# Patient Record
Sex: Female | Born: 1975 | Race: White | Hispanic: No | Marital: Married | Smoking: Never smoker
Health system: Southern US, Community
[De-identification: ages and names within clinical notes are randomized; demographics above are authoritative.]

## PROBLEM LIST (undated history)

## (undated) DIAGNOSIS — N200 Calculus of kidney: Secondary | ICD-10-CM

## (undated) DIAGNOSIS — I10 Essential (primary) hypertension: Secondary | ICD-10-CM

## (undated) DIAGNOSIS — E119 Type 2 diabetes mellitus without complications: Secondary | ICD-10-CM

## (undated) HISTORY — PX: LITHOTRIPSY: SUR834

---

## 1999-10-25 ENCOUNTER — Other Ambulatory Visit: Admission: RE | Admit: 1999-10-25 | Discharge: 1999-10-25 | Payer: Self-pay | Admitting: Otolaryngology

## 2009-12-10 ENCOUNTER — Inpatient Hospital Stay: Payer: Self-pay | Admitting: Internal Medicine

## 2009-12-27 ENCOUNTER — Ambulatory Visit: Payer: Self-pay

## 2010-01-06 ENCOUNTER — Ambulatory Visit: Payer: Self-pay

## 2010-02-06 ENCOUNTER — Ambulatory Visit: Payer: Self-pay

## 2010-03-15 ENCOUNTER — Ambulatory Visit: Payer: Self-pay

## 2010-04-07 ENCOUNTER — Ambulatory Visit: Payer: Self-pay

## 2011-11-20 ENCOUNTER — Ambulatory Visit: Payer: Self-pay | Admitting: Obstetrics and Gynecology

## 2011-11-23 ENCOUNTER — Ambulatory Visit: Payer: Self-pay | Admitting: Obstetrics and Gynecology

## 2012-07-31 ENCOUNTER — Ambulatory Visit: Payer: Self-pay | Admitting: Urology

## 2012-07-31 LAB — BASIC METABOLIC PANEL
Anion Gap: 5 — ABNORMAL LOW (ref 7–16)
BUN: 10 mg/dL (ref 7–18)
Co2: 29 mmol/L (ref 21–32)
Creatinine: 0.89 mg/dL (ref 0.60–1.30)
EGFR (Non-African Amer.): >60
Sodium: 140 mmol/L (ref 136–145)

## 2012-08-01 ENCOUNTER — Ambulatory Visit: Payer: Self-pay | Admitting: Urology

## 2012-08-14 ENCOUNTER — Ambulatory Visit: Payer: Self-pay | Admitting: Urology

## 2012-08-15 ENCOUNTER — Ambulatory Visit: Payer: Self-pay | Admitting: Urology

## 2012-10-10 ENCOUNTER — Ambulatory Visit: Payer: Self-pay | Admitting: Unknown Physician Specialty

## 2012-10-10 LAB — BASIC METABOLIC PANEL
Anion Gap: 4 — ABNORMAL LOW (ref 7–16)
BUN: 11 mg/dL (ref 7–18)
Calcium, Total: 8.8 mg/dL (ref 8.5–10.1)
Chloride: 102 mmol/L (ref 98–107)
Co2: 29 mmol/L (ref 21–32)
Creatinine: 0.79 mg/dL (ref 0.60–1.30)
EGFR (African American): >60
EGFR (Non-African Amer.): >60
Osmolality: 278 (ref 275–301)
Potassium: 3.8 mmol/L (ref 3.5–5.1)
Sodium: 135 mmol/L — ABNORMAL LOW (ref 136–145)

## 2012-10-14 ENCOUNTER — Ambulatory Visit: Payer: Self-pay | Admitting: Urology

## 2014-05-29 NOTE — H&P (Signed)
PATIENT NAME:  Caitlin Blackburn, Caitlin Blackburn MR#:  [id number] DATE OF BIRTH:  March 15, 1975  DATE OF ADMISSION:  10/14/2012  CHIEF COMPLAINT: Kidney stone.   HISTORY OF PRESENT ILLNESS: Caitlin Blackburn is a 39 year old white female with a large left renal pelvic stone who underwent lithotripsy June 26 and July 10. She has a residual distal left ureteral fragment present and comes in now for ureteroscopic ureterolithotomy with holmium laser lithotripsy.   ALLERGIES: No drug allergies.   CURRENT MEDICATIONS: Humalog insulin, Levemir insulin, quinapril, HCTZ, phentermine, and allergy shots.   PAST SURGICAL HISTORY:  Sinus surgery in 2002.   SOCIAL HISTORY: The patient denied tobacco or alcohol use.   FAMILY HISTORY: Remarkable for grandparents with diabetes and hypertension.   PAST AND CURRENT MEDICAL CONDITIONS:  1.  Diabetes.  2.  Hypertension.  3.  Obesity.  4.  Allergic rhinitis.   REVIEW OF SYSTEMS:  The patient denies chest pain, shortness of breath, stroke, or heart disease.   PHYSICAL EXAMINATION: GENERAL: Obese white female in no distress.  HEENT: Sclerae were clear. Pupils are equally round and reactive to light and accommodation. Extraocular motion intact.  NECK: Supple. No palpable cervical adenopathy. No audible carotid bruits. Thyroid gland was smooth without palpable nodules.  LUNGS: Clear to auscultation.  CARDIOVASCULAR: Regular rhythm and rate without audible murmurs.  ABDOMEN: Soft, nontender abdomen.  GENITOURINARY AND RECTAL: Deferred.  NEUROMUSCULAR: Alert and oriented x3.   IMPRESSION: Left ureterolithiasis.   PLAN: Left ureteroscopic ureterolithotomy with holmium laser lithotripsy.    ____________________________ Otelia Limes. Yves Dill, MD [email or web address] D: 10/09/2012 11:02:38 ET T: 10/09/2012 11:39:04 ET JOB#: [id number]  cc: Otelia Limes. Yves Dill, MD, <Dictator> Royston Cowper MD ELECTRONICALLY SIGNED 10/09/2012 11:54

## 2014-05-29 NOTE — Op Note (Signed)
PATIENT NAME:  Caitlin Blackburn, Caitlin Blackburn MR#:  [id number] DATE OF BIRTH:  1975-04-10  DATE OF PROCEDURE:  10/14/2012  PREOPERATIVE DIAGNOSIS: Left ureterolithiasis.   POSTOPERATIVE DIAGNOSIS: Left ureterolithiasis.   PROCEDURES:  1.  Left ureteroscopic ureterolithotomy.  2.  Holmium laser lithotripsy. 3.  Stent placement.  4.  Fluoroscopy.   SURGEON: Maryan Puls, M.D.   ANESTHETIST: [facility] and Yves Dill.  ANESTHETIC METHOD: General per [facility] and local per Dr. Yves Dill.   INDICATIONS: See the dictated history and physical. After informed consent, the patient requests the above procedures.   OPERATIVE SUMMARY: After adequate general anesthesia had been obtained, the patient was placed into dorsal lithotomy position and the perineum was prepped and draped in the usual fashion. Fluoroscopy revealed a distal left ureteral stone. At this point, the mini-Storz ureteroscope was coupled to the camera and then visually advanced into the distal ureter. A stone was encountered. The 500 micron holmium laser fiber was introduced through the scope and the stone was disintegrated. A 0.035 Glidewire was then passed up the ureter under fluoroscopic guidance. Ureteroscope was then removed taking care to leave the guidewire in position. The cystoscope was back loaded over the guidewire and a 6 x 24 cm double pigtail stent was advanced over the guidewire using fluoroscopic guidance. The guidewire was then removed taking care to leave the stent in position. The cystoscope was removed again taking care to leave the stent in position. Viscous Xylocaine 10 mL was instilled within the urethra and the bladder. B and O suppository was placed. The procedure was then terminated and the patient was transferred to the recovery room in stable condition.  ____________________________ Otelia Limes. Yves Dill, MD [email or web address] D: 10/14/2012 10:10:14 ET T: 10/14/2012 10:16:27 ET JOB#: [id number]  cc: Otelia Limes. Yves Dill, MD, <Dictator> Royston Cowper MD ELECTRONICALLY SIGNED 10/14/2012 11:51

## 2014-08-13 DIAGNOSIS — N871 Moderate cervical dysplasia: Secondary | ICD-10-CM | POA: Insufficient documentation

## 2014-08-17 ENCOUNTER — Ambulatory Visit
Admission: RE | Admit: 2014-08-17 | Discharge: 2014-08-17 | Disposition: A | Discharge status: Home / Self Care | Payer: BC Managed Care – PPO | Source: Ambulatory Visit | Attending: Obstetrics and Gynecology | Admitting: Obstetrics and Gynecology

## 2014-08-17 ENCOUNTER — Other Ambulatory Visit: Payer: Self-pay | Admitting: Obstetrics and Gynecology

## 2014-08-17 DIAGNOSIS — O200 Threatened abortion: Secondary | ICD-10-CM | POA: Diagnosis not present

## 2014-08-17 DIAGNOSIS — O0991 Supervision of high risk pregnancy, unspecified, first trimester: Secondary | ICD-10-CM | POA: Insufficient documentation

## 2014-08-18 ENCOUNTER — Other Ambulatory Visit
Admission: RE | Admit: 2014-08-18 | Discharge: 2014-08-18 | Disposition: A | Discharge status: Home / Self Care | Payer: BC Managed Care – PPO | Source: Other Acute Inpatient Hospital | Attending: Obstetrics and Gynecology | Admitting: Obstetrics and Gynecology

## 2014-08-18 DIAGNOSIS — O200 Threatened abortion: Secondary | ICD-10-CM | POA: Diagnosis not present

## 2014-08-18 LAB — ABO/RH: ABO/RH(D): O POS

## 2014-08-19 LAB — ABO/RH: ABO/RH(D): O POS

## 2014-11-09 ENCOUNTER — Emergency Department: Payer: BC Managed Care – PPO

## 2014-11-09 ENCOUNTER — Encounter: Payer: Self-pay | Admitting: Emergency Medicine

## 2014-11-09 ENCOUNTER — Emergency Department
Admission: EM | Admit: 2014-11-09 | Discharge: 2014-11-09 | Disposition: A | Discharge status: Home / Self Care | Payer: BC Managed Care – PPO | Attending: Emergency Medicine | Admitting: Emergency Medicine

## 2014-11-09 DIAGNOSIS — Z720 Tobacco use: Secondary | ICD-10-CM | POA: Diagnosis not present

## 2014-11-09 DIAGNOSIS — R110 Nausea: Secondary | ICD-10-CM | POA: Insufficient documentation

## 2014-11-09 DIAGNOSIS — Z3202 Encounter for pregnancy test, result negative: Secondary | ICD-10-CM | POA: Insufficient documentation

## 2014-11-09 DIAGNOSIS — Z79899 Other long term (current) drug therapy: Secondary | ICD-10-CM | POA: Diagnosis not present

## 2014-11-09 DIAGNOSIS — E119 Type 2 diabetes mellitus without complications: Secondary | ICD-10-CM | POA: Diagnosis not present

## 2014-11-09 DIAGNOSIS — R1032 Left lower quadrant pain: Secondary | ICD-10-CM

## 2014-11-09 DIAGNOSIS — I10 Essential (primary) hypertension: Secondary | ICD-10-CM | POA: Insufficient documentation

## 2014-11-09 HISTORY — DX: Calculus of kidney: N20.0

## 2014-11-09 HISTORY — DX: Essential (primary) hypertension: I10

## 2014-11-09 HISTORY — DX: Type 2 diabetes mellitus without complications: E11.9

## 2014-11-09 LAB — COMPREHENSIVE METABOLIC PANEL
ALBUMIN: 4 g/dL (ref 3.5–5.0)
ALT: 23 U/L (ref 14–54)
ANION GAP: 7 (ref 5–15)
AST: 26 U/L (ref 15–41)
Alkaline Phosphatase: 89 U/L (ref 38–126)
BUN: 11 mg/dL (ref 6–20)
CALCIUM: 9.4 mg/dL (ref 8.9–10.3)
CO2: 27 mmol/L (ref 22–32)
Chloride: 105 mmol/L (ref 101–111)
Creatinine, Ser: 0.64 mg/dL (ref 0.44–1.00)
GFR calc Af Amer: >60 mL/min (ref >60)
GFR calc non Af Amer: >60 mL/min (ref >60)
Glucose, Bld: 147 mg/dL — ABNORMAL HIGH (ref 65–99)
Potassium: 3.8 mmol/L (ref 3.5–5.1)
Sodium: 139 mmol/L (ref 135–145)
TOTAL PROTEIN: 7.1 g/dL (ref 6.5–8.1)
Total Bilirubin: 1.1 mg/dL (ref 0.3–1.2)

## 2014-11-09 LAB — URINALYSIS COMPLETE WITH MICROSCOPIC (ARMC ONLY)
BILIRUBIN URINE: NEGATIVE
GLUCOSE, UA: 50 mg/dL — AB
Ketones, ur: NEGATIVE mg/dL
Leukocytes, UA: NEGATIVE
NITRITE: NEGATIVE
Protein, ur: NEGATIVE mg/dL
Specific Gravity, Urine: 1.003 — ABNORMAL LOW (ref 1.005–1.030)
WBC, UA: NONE SEEN WBC/hpf (ref 0–5)
pH: 7 (ref 5.0–8.0)

## 2014-11-09 LAB — CBC
HEMATOCRIT: 39.9 % (ref 35.0–47.0)
HEMOGLOBIN: 13.3 g/dL (ref 12.0–16.0)
MCH: 27.6 pg (ref 26.0–34.0)
MCHC: 33.4 g/dL (ref 32.0–36.0)
MCV: 82.8 fL (ref 80.0–100.0)
Platelets: 253 10*3/uL (ref 150–440)
RBC: 4.82 MIL/uL (ref 3.80–5.20)
RDW: 13.1 % (ref 11.5–14.5)
WBC: 7.3 10*3/uL (ref 3.6–11.0)

## 2014-11-09 LAB — LIPASE, BLOOD: Lipase: 19 U/L — ABNORMAL LOW (ref 22–51)

## 2014-11-09 LAB — POCT PREGNANCY, URINE: Preg Test, Ur: NEGATIVE

## 2014-11-09 MED ORDER — IOHEXOL 300 MG/ML  SOLN
125.0000 mL | Freq: Once | INTRAMUSCULAR | Status: AC | PRN
  Administered 2014-11-09: 125 mL via INTRAVENOUS

## 2014-11-09 MED ORDER — IOHEXOL 240 MG/ML SOLN
25.0000 mL | Freq: Once | INTRAMUSCULAR | Status: DC | PRN
  Administered 2014-11-09: 25 mL via ORAL
  Filled 2014-11-09: qty 50

## 2014-11-09 MED ORDER — TRAMADOL HCL 50 MG PO TABS: 50.0000 mg | ORAL_TABLET | Freq: Four times a day (QID) | ORAL | Status: AC | PRN

## 2014-11-09 NOTE — Discharge Instructions (Signed)
Abdominal Pain, Women °Abdominal (stomach, pelvic, or belly) pain can be caused by many things. It is important to tell your doctor: °· The location of the pain. °· Does it come and go or is it present all the time? °· Are there things that start the pain (eating certain foods, exercise)? °· Are there other symptoms associated with the pain (fever, nausea, vomiting, diarrhea)? °All of this is helpful to know when trying to find the cause of the pain. °CAUSES  °· Stomach: virus or bacteria infection, or ulcer. °· Intestine: appendicitis (inflamed appendix), regional ileitis (Crohn's disease), ulcerative colitis (inflamed colon), irritable bowel syndrome, diverticulitis (inflamed diverticulum of the colon), or cancer of the stomach or intestine. °· Gallbladder disease or stones in the gallbladder. °· Kidney disease, kidney stones, or infection. °· Pancreas infection or cancer. °· Fibromyalgia (pain disorder). °· Diseases of the female organs: °¨ Uterus: fibroid (non-cancerous) tumors or infection. °¨ Fallopian tubes: infection or tubal pregnancy. °¨ Ovary: cysts or tumors. °¨ Pelvic adhesions (scar tissue). °¨ Endometriosis (uterus lining tissue growing in the pelvis and on the pelvic organs). °¨ Pelvic congestion syndrome (female organs filling up with blood just before the menstrual period). °¨ Pain with the menstrual period. °¨ Pain with ovulation (producing an egg). °¨ Pain with an IUD (intrauterine device, birth control) in the uterus. °¨ Cancer of the female organs. °· Functional pain (pain not caused by a disease, may improve without treatment). °· Psychological pain. °· Depression. °DIAGNOSIS  °Your doctor will decide the seriousness of your pain by doing an examination. °· Blood tests. °· X-rays. °· Ultrasound. °· CT scan (computed tomography, special type of X-ray). °· MRI (magnetic resonance imaging). °· Cultures, for infection. °· Barium enema (dye inserted in the large intestine, to better view it with  X-rays). °· Colonoscopy (looking in intestine with a lighted tube). °· Laparoscopy (minor surgery, looking in abdomen with a lighted tube). °· Major abdominal exploratory surgery (looking in abdomen with a large incision). °TREATMENT  °The treatment will depend on the cause of the pain.  °· Many cases can be observed and treated at home. °· Over-the-counter medicines recommended by your caregiver. °· Prescription medicine. °· Antibiotics, for infection. °· Birth control pills, for painful periods or for ovulation pain. °· Hormone treatment, for endometriosis. °· Nerve blocking injections. °· Physical therapy. °· Antidepressants. °· Counseling with a psychologist or psychiatrist. °· Minor or major surgery. °HOME CARE INSTRUCTIONS  °· Do not take laxatives, unless directed by your caregiver. °· Take over-the-counter pain medicine only if ordered by your caregiver. Do not take aspirin because it can cause an upset stomach or bleeding. °· Try a clear liquid diet (broth or water) as ordered by your caregiver. Slowly move to a bland diet, as tolerated, if the pain is related to the stomach or intestine. °· Have a thermometer and take your temperature several times a day, and record it. °· Bed rest and sleep, if it helps the pain. °· Avoid sexual intercourse, if it causes pain. °· Avoid stressful situations. °· Keep your follow-up appointments and tests, as your caregiver orders. °· If the pain does not go away with medicine or surgery, you may try: °¨ Acupuncture. °¨ Relaxation exercises (yoga, meditation). °¨ Group therapy. °¨ Counseling. °SEEK MEDICAL CARE IF:  °· You notice certain foods cause stomach pain. °· Your home care treatment is not helping your pain. °· You need stronger pain medicine. °· You want your IUD removed. °· You feel faint or   lightheaded. °· You develop nausea and vomiting. °· You develop a rash. °· You are having side effects or an allergy to your medicine. °SEEK IMMEDIATE MEDICAL CARE IF:  °· Your  pain does not go away or gets worse. °· You have a fever. °· Your pain is felt only in portions of the abdomen. The right side could possibly be appendicitis. The left lower portion of the abdomen could be colitis or diverticulitis. °· You are passing blood in your stools (bright red or black tarry stools, with or without vomiting). °· You have blood in your urine. °· You develop chills, with or without a fever. °· You pass out. °MAKE SURE YOU:  °· Understand these instructions. °· Will watch your condition. °· Will get help right away if you are not doing well or get worse. °Document Released: 11/20/2006 Document Revised: 06/09/2013 Document Reviewed: 12/10/2008 °[employer]® Patient Information ©2015 [employer]. This information is not intended to replace advice given to you by your health care provider. Make sure you discuss any questions you have with your health care provider. ° °

## 2014-11-09 NOTE — ED Notes (Signed)
Left abd pain since yesterday, denies n/v/d. Skin w/d with good color

## 2014-11-09 NOTE — ED Provider Notes (Signed)
[facility] Emergency Department Provider Note  ____________________________________________  Time seen: 10 AM  I have reviewed the triage vital signs and the nursing notes.   HISTORY  Chief Complaint Abdominal Pain    HPI Caitlin Blackburn is a 39 y.o. female who presents with complaints of left lower abdominal pain which began yesterday morning. She reports the pain is moderate to severe and sharp in nature. She has had mild nausea but no vomiting. No diarrhea. She has never had this pain before. She does have a history of diabetes denies dysuria or frequency. No fevers no chills. No recent travel. No sick contacts. The pain does not travel anywhere. Pushing on the area makes it worse     Past Medical History  Diagnosis Date  . Diabetes mellitus without complication ([place])   . Hypertension   . Kidney stones     There are no active problems to display for this patient.   Past Surgical History  Procedure Laterality Date  . Lithotripsy      Current Outpatient Rx  Name  Route  Sig  Dispense  Refill  . LANTUS SOLOSTAR 100 UNIT/ML Solostar Pen   Subcutaneous   Inject 22 Units into the skin at bedtime.      0     Dispense as written.   . metFORMIN (GLUCOPHAGE) 500 MG tablet   Oral   Take 500 mg by mouth 2 (two) times daily with a meal.      0   . methyldopa (ALDOMET) 500 MG tablet   Oral   Take 1 tablet by mouth 3 (three) times daily.      1   . NOVOLOG FLEXPEN 100 UNIT/ML FlexPen   Subcutaneous   Inject 8-19 Units into the skin 3 (three) times daily with meals. 8 am, 14 lunch, 19 dinner      0     Dispense as written.     Allergies Levaquin  History reviewed. No pertinent family history.  Social History Social History  Substance Use Topics  . Smoking status: Current Every Day Smoker  . Smokeless tobacco: None  . Alcohol Use: No    Review of Systems  Constitutional: Negative for fever. Eyes: Negative for visual  changes. ENT: Negative for sore throat Cardiovascular: Negative for chest pain. Respiratory: Negative for shortness of breath. Gastrointestinal: Positive for abdominal pain and nausea Genitourinary: Negative for dysuria. Musculoskeletal: Negative for back pain. Skin: Negative for rash. Neurological: Negative for headaches or focal weakness Psychiatric: No anxiety    ____________________________________________   PHYSICAL EXAM:  VITAL SIGNS: ED Triage Vitals  Enc Vitals Group     BP 11/09/14 0907 175/61 mmHg     Pulse Rate 11/09/14 0907 105     Resp 11/09/14 0907 18     Temp 11/09/14 0907 98.6 F (37 C)     Temp Source 11/09/14 0907 Oral     SpO2 11/09/14 0907 97 %     Weight 11/09/14 0907 250 lb (113.399 kg)     Height 11/09/14 0907 5\' 6"  (1.676 m)     Head Cir --      Peak Flow --      Pain Score 11/09/14 0907 5     Pain Loc --      Pain Edu? --      Excl. in [place]? --      Constitutional: Alert and oriented. Well appearing and in no distress. Eyes: Conjunctivae are normal.  ENT  Head: Normocephalic and atraumatic.   Mouth/Throat: Mucous membranes are moist. Cardiovascular: Normal rate, regular rhythm. Normal and symmetric distal pulses are present in all extremities. No murmurs, rubs, or gallops. Respiratory: Normal respiratory effort without tachypnea nor retractions. Breath sounds are clear and equal bilaterally.  Gastrointestinal: Mild tenderness to palpation of the left lower quadrant. No distention. There is no CVA tenderness. Genitourinary: deferred Musculoskeletal: Nontender with normal range of motion in all extremities. No lower extremity tenderness nor edema. Neurologic:  Normal speech and language. No gross focal neurologic deficits are appreciated. Skin:  Skin is warm, dry and intact. No rash noted. Psychiatric: Mood and affect are normal. Patient exhibits appropriate insight and judgment.  ____________________________________________    LABS  (pertinent positives/negatives)  Labs Reviewed  URINALYSIS COMPLETEWITH MICROSCOPIC ([place]) - Abnormal; Notable for the following:    Color, Urine STRAW (*)    APPearance CLEAR (*)    Glucose, UA 50 (*)    Specific Gravity, Urine 1.003 (*)    Hgb urine dipstick 1+ (*)    Bacteria, UA RARE (*)    Squamous Epithelial / LPF 0-5 (*)    All other components within normal limits  CBC  COMPREHENSIVE METABOLIC PANEL  LIPASE, BLOOD    ____________________________________________   EKG  None  ____________________________________________    RADIOLOGY I have personally reviewed any xrays that were ordered on this patient: CT abdomen and pelvis shows no acute abnormalities  ____________________________________________   PROCEDURES  Procedure(s) performed: none  Critical Care performed: none  ____________________________________________   INITIAL IMPRESSION / ASSESSMENT AND PLAN / ED COURSE  Pertinent labs & imaging results that were available during my care of the patient were reviewed by me and considered in my medical decision making (see chart for details).  Patient overall well-appearing. She has some tenderness to palpation in the left lower quadrant of her abdomen. Differential includes urinary tract infection, kidney stone, diverticulitis. We will check labs and determine the need for imaging  Patient's labs and imaging are unremarkable. In fact she is feeling slightly better in the emergency department. We discussed how to proceed and we both agreed that patient will be discharged home and we will give this 24 hours to see if it improves on its own. She agrees to return to the emergency department if any worsening in her pain  ____________________________________________   FINAL CLINICAL IMPRESSION(S) / ED DIAGNOSES  Final diagnoses:  Left lower quadrant pain     Lavonia Drafts, MD 11/09/14 1424

## 2015-11-29 ENCOUNTER — Other Ambulatory Visit: Payer: Self-pay | Admitting: Obstetrics and Gynecology

## 2015-11-29 DIAGNOSIS — Z1231 Encounter for screening mammogram for malignant neoplasm of breast: Secondary | ICD-10-CM

## 2016-01-03 ENCOUNTER — Encounter: Payer: Self-pay | Admitting: Radiology

## 2016-01-03 ENCOUNTER — Ambulatory Visit
Admission: RE | Admit: 2016-01-03 | Discharge: 2016-01-03 | Disposition: A | Discharge status: Home / Self Care | Payer: BC Managed Care – PPO | Source: Ambulatory Visit | Attending: Obstetrics and Gynecology | Admitting: Obstetrics and Gynecology

## 2016-01-03 DIAGNOSIS — Z1231 Encounter for screening mammogram for malignant neoplasm of breast: Secondary | ICD-10-CM

## 2016-01-03 DIAGNOSIS — R928 Other abnormal and inconclusive findings on diagnostic imaging of breast: Secondary | ICD-10-CM | POA: Diagnosis not present

## 2016-01-10 ENCOUNTER — Other Ambulatory Visit: Payer: Self-pay | Admitting: Obstetrics and Gynecology

## 2016-01-10 DIAGNOSIS — R928 Other abnormal and inconclusive findings on diagnostic imaging of breast: Secondary | ICD-10-CM

## 2016-01-21 ENCOUNTER — Ambulatory Visit
Admission: RE | Admit: 2016-01-21 | Discharge: 2016-01-21 | Disposition: A | Discharge status: Home / Self Care | Payer: BC Managed Care – PPO | Source: Ambulatory Visit | Attending: Obstetrics and Gynecology | Admitting: Obstetrics and Gynecology

## 2016-01-21 DIAGNOSIS — R928 Other abnormal and inconclusive findings on diagnostic imaging of breast: Secondary | ICD-10-CM | POA: Diagnosis present

## 2016-04-24 ENCOUNTER — Ambulatory Visit: Payer: Self-pay | Admitting: Podiatry

## 2016-12-04 ENCOUNTER — Other Ambulatory Visit: Payer: Self-pay | Admitting: Obstetrics and Gynecology

## 2016-12-04 DIAGNOSIS — Z1231 Encounter for screening mammogram for malignant neoplasm of breast: Secondary | ICD-10-CM

## 2017-01-16 ENCOUNTER — Encounter (INDEPENDENT_AMBULATORY_CARE_PROVIDER_SITE_OTHER): Payer: Self-pay | Admitting: Vascular Surgery

## 2017-02-01 ENCOUNTER — Encounter (INDEPENDENT_AMBULATORY_CARE_PROVIDER_SITE_OTHER): Payer: Self-pay | Admitting: Vascular Surgery

## 2017-02-01 ENCOUNTER — Ambulatory Visit (INDEPENDENT_AMBULATORY_CARE_PROVIDER_SITE_OTHER): Payer: BC Managed Care – PPO | Admitting: Vascular Surgery

## 2017-02-01 VITALS — BP 148/90 | HR 81 | Resp 17 | Ht 67.0 in | Wt 284.0 lb

## 2017-02-01 DIAGNOSIS — M79605 Pain in left leg: Secondary | ICD-10-CM

## 2017-02-01 DIAGNOSIS — M79604 Pain in right leg: Secondary | ICD-10-CM | POA: Diagnosis not present

## 2017-02-01 DIAGNOSIS — E118 Type 2 diabetes mellitus with unspecified complications: Secondary | ICD-10-CM

## 2017-02-01 DIAGNOSIS — R600 Localized edema: Secondary | ICD-10-CM | POA: Diagnosis not present

## 2017-02-01 DIAGNOSIS — I10 Essential (primary) hypertension: Secondary | ICD-10-CM | POA: Diagnosis not present

## 2017-02-01 NOTE — Progress Notes (Signed)
Subjective:    Patient ID: Caitlin Blackburn, female    DOB: 1975-11-28, 41 y.o.   MRN: [id number] Chief Complaint  Patient presents with  . Leg Swelling    NP,ref Solom ble swelling   Presents as a new patient referred by Dr. Mickie Kay for evaluation of "bilateral lower extremity edema".  The patient notes a long-standing history of experiencing edema to her legs for approximately the last 2 years.  About 2 years ago, the patient was started on fluid pills with minimal improvement to her edema.  The patient was seen by [facility] and sounds like possibly underwent greater saphenous vein ablation to the right leg.  It also seems that she underwent sclerotherapy to the varicosities on her lower extremity.  The patient notes her swelling and associated discomfort worsens towards the end of the day.  The patient notes right thigh pain with ambulation.  The patient does not engage in conservative therapy at this time.  The patient denies any surgery or trauma to the bilateral lower extremity.  The patient denies any DVT history to the bilateral lower extremity.  The patient denies any fever, nausea vomiting.   Review of Systems  Constitutional: Negative.   HENT: Negative.   Eyes: Negative.   Respiratory: Negative.   Cardiovascular: Positive for leg swelling.       Right thigh claudication  Gastrointestinal: Negative.   Endocrine: Negative.   Genitourinary: Negative.   Musculoskeletal: Negative.   Skin: Negative.   Allergic/Immunologic: Negative.   Neurological: Negative.   Hematological: Negative.   Psychiatric/Behavioral: Negative.       Objective:   Physical Exam  Constitutional: She is oriented to person, place, and time. She appears well-developed and well-nourished. No distress.  HENT:  Head: Normocephalic and atraumatic.  Eyes: Conjunctivae are normal. Pupils are equal, round, and reactive to light.  Neck: Normal range of motion.  Cardiovascular: Normal rate, regular rhythm, normal  heart sounds and intact distal pulses.  Pulses:      Radial pulses are 2+ on the right side, and 2+ on the left side.  Hard to palpate pedal pulses due to body habitus and edema however her bilateral feet are warm  Pulmonary/Chest: Effort normal and breath sounds normal.  Musculoskeletal: Normal range of motion. She exhibits edema (Mild to moderate nonpitting edema noted bilaterally).  Neurological: She is alert and oriented to person, place, and time.  Skin: She is not diaphoretic.  Minimal less than 1 cm scattered varicosities to the bilateral lower extremity.  There is no skin thickening or stasis dermatitis bilaterally.  There is no active cellulitis.  Psychiatric: She has a normal mood and affect. Her behavior is normal. Judgment and thought content normal.  Vitals reviewed.  BP (!) 148/90 (BP Location: Right Arm)   Pulse 81   Resp 17   Ht 5\' 7"  (1.702 m)   Wt 284 lb (128.8 kg)   BMI 44.48 kg/m   Past Medical History:  Diagnosis Date  . Diabetes mellitus without complication ([place])   . Hypertension   . Kidney stones    Social History   Socioeconomic History  . Marital status: Married    Spouse name: Not on file  . Number of children: Not on file  . Years of education: Not on file  . Highest education level: Not on file  Social Needs  . Financial resource strain: Not on file  . Food insecurity - worry: Not on file  . Food insecurity -  inability: Not on file  . Transportation needs - medical: Not on file  . Transportation needs - non-medical: Not on file  Occupational History  . Not on file  Tobacco Use  . Smoking status: Never Smoker  . Smokeless tobacco: Never Used  Substance and Sexual Activity  . Alcohol use: No  . Drug use: No  . Sexual activity: Not on file  Other Topics Concern  . Not on file  Social History Narrative  . Not on file   Past Surgical History:  Procedure Laterality Date  . LITHOTRIPSY     Family History  Problem Relation Age of Onset    . Diabetes Maternal Grandmother    Allergies  Allergen Reactions  . Levaquin [Levofloxacin In D5w] Swelling      Assessment & Plan:  Presents as a new patient referred by Dr. Mickie Kay for evaluation of "bilateral lower extremity edema".  The patient notes a long-standing history of experiencing edema to her legs for approximately the last 2 years.  About 2 years ago, the patient was started on fluid pills with minimal improvement to her edema.  The patient was seen by [facility] and sounds like possibly underwent greater saphenous vein ablation to the right leg.  It also seems that she underwent sclerotherapy to the varicosities on her lower extremity.  The patient notes her swelling and associated discomfort worsens towards the end of the day.  The patient notes right thigh pain with ambulation.  The patient does not engage in conservative therapy at this time.  The patient denies any surgery or trauma to the bilateral lower extremity.  The patient denies any DVT history to the bilateral lower extremity.  The patient denies any fever, nausea vomiting.  1. Bilateral lower extremity edema - New The patient was encouraged to wear graduated compression stockings (20-30 mmHg) on a daily basis. The patient was instructed to begin wearing the stockings first thing in the morning and removing them in the evening. The patient was instructed specifically not to sleep in the stockings. Prescription given. In addition, behavioral modification including elevation during the day will be initiated. Anti-inflammatories for pain. The patient will follow up in three months to asses conservative management.  Information on chronic venous insufficiency and compression stockings was given to the patient. The patient was instructed to call the office in the interim if any worsening edema or ulcerations to the legs, feet or toes occurs. The patient expresses their understanding.  - VAS [place] LOWER EXTREMITY VENOUS  REFLUX; Future  2. Lower extremity pain, bilateral - New Patient has multiple risk factors for peripheral artery disease Hard to palpate pedal pulses on exam Right thigh claudication And I will bring the patient back to undergo an ABI at her convenience  - VAS [place] ABI WITH/WO TBI; Future  3. Type 2 diabetes mellitus with complication, unspecified whether long term insulin use (HCC) - Stable Encouraged good control as its slows the progression of atherosclerotic disease  4. Essential hypertension - Stable Encouraged good control as its slows the progression of atherosclerotic disease  Current Outpatient Medications on File Prior to Visit  Medication Sig Dispense Refill  . aspirin EC 81 MG tablet Take by mouth.    Marland Kitchen LANTUS SOLOSTAR 100 UNIT/ML Solostar Pen Inject 40 Units into the skin at bedtime.   0  . NOVOLOG FLEXPEN 100 UNIT/ML FlexPen Inject 8-19 Units into the skin 3 (three) times daily with meals. 14 am, 16 lunch, 24 dinner  0  .  quinapril-hydrochlorothiazide (ACCURETIC) 20-12.5 MG tablet   0  . metFORMIN (GLUCOPHAGE) 500 MG tablet Take 500 mg by mouth 2 (two) times daily with a meal.  0  . methyldopa (ALDOMET) 500 MG tablet Take 1 tablet by mouth 3 (three) times daily.  1   No current facility-administered medications on file prior to visit.    There are no Patient Instructions on file for this visit. No Follow-up on file.  Deshundra Waller A Malu Pellegrini, PA-C

## 2017-02-07 ENCOUNTER — Ambulatory Visit
Admission: RE | Admit: 2017-02-07 | Discharge: 2017-02-07 | Disposition: A | Discharge status: Home / Self Care | Payer: BC Managed Care – PPO | Source: Ambulatory Visit | Attending: Obstetrics and Gynecology | Admitting: Obstetrics and Gynecology

## 2017-02-07 DIAGNOSIS — Z1231 Encounter for screening mammogram for malignant neoplasm of breast: Secondary | ICD-10-CM | POA: Diagnosis present

## 2017-05-03 ENCOUNTER — Encounter (INDEPENDENT_AMBULATORY_CARE_PROVIDER_SITE_OTHER): Payer: Self-pay | Admitting: Vascular Surgery

## 2017-05-03 ENCOUNTER — Ambulatory Visit (INDEPENDENT_AMBULATORY_CARE_PROVIDER_SITE_OTHER): Payer: BC Managed Care – PPO | Admitting: Vascular Surgery

## 2017-05-03 ENCOUNTER — Encounter (INDEPENDENT_AMBULATORY_CARE_PROVIDER_SITE_OTHER): Payer: BC Managed Care – PPO

## 2017-05-03 ENCOUNTER — Ambulatory Visit (INDEPENDENT_AMBULATORY_CARE_PROVIDER_SITE_OTHER): Payer: BC Managed Care – PPO

## 2017-05-03 VITALS — BP 143/88 | HR 81 | Resp 18 | Ht 67.0 in | Wt 289.0 lb

## 2017-05-03 DIAGNOSIS — R600 Localized edema: Secondary | ICD-10-CM

## 2017-05-03 DIAGNOSIS — I872 Venous insufficiency (chronic) (peripheral): Secondary | ICD-10-CM

## 2017-05-03 DIAGNOSIS — I890 Lymphedema, not elsewhere classified: Secondary | ICD-10-CM

## 2017-05-04 ENCOUNTER — Encounter (INDEPENDENT_AMBULATORY_CARE_PROVIDER_SITE_OTHER): Payer: Self-pay | Admitting: Vascular Surgery

## 2017-05-04 DIAGNOSIS — I890 Lymphedema, not elsewhere classified: Secondary | ICD-10-CM | POA: Insufficient documentation

## 2017-05-04 DIAGNOSIS — I872 Venous insufficiency (chronic) (peripheral): Secondary | ICD-10-CM | POA: Insufficient documentation

## 2017-05-04 NOTE — Progress Notes (Signed)
Subjective:    Patient ID: Caitlin Blackburn, female    DOB: February 14, 1975, 42 y.o.   MRN: [id number] Chief Complaint  Patient presents with  . Follow-up    ABI Bilateral reflux   Patient presents to review vascular studies.  The patient was last seen on February 01, 2017 and evaluation of bilateral lower extremity edema and discomfort.  Since her initial visit, the patient has been engaging in conservative therapy including wearing medical grade 1 compression socks, elevating her legs and remaining active with minimal improvement in her symptoms.  The patient has symptoms have progressed to the point that she is unable to function on a daily basis.  The patient feels that her discomfort has become lifestyle limiting.  The patient underwent a bilateral venous reflux exam which was notable for abnormal reflux times noted to the right lower extremity in the great saphenous vein at the groin and an accessory branch coming off of a previously ablated right great saphenous vein.  The patient was found to have abnormal reflux times in the left lower extremity to the great saphenous vein for approximately mid thigh to the proximal calf and small saphenous vein at the level of the saphenous popliteal junction.  There is no evidence of deep vein or superficial thrombophlebitis to the bilateral lower extremity.  The patient denies any fever, nausea or vomiting.  Review of Systems  Constitutional: Negative.   HENT: Negative.   Eyes: Negative.   Respiratory: Negative.   Cardiovascular:       Painful varicose veins  Gastrointestinal: Negative.   Endocrine: Negative.   Genitourinary: Negative.   Musculoskeletal: Negative.   Skin: Negative.   Allergic/Immunologic: Negative.   Neurological: Negative.   Hematological: Negative.   Psychiatric/Behavioral: Negative.       Objective:   Physical Exam  Constitutional: She is oriented to person, place, and time. She appears well-developed and well-nourished. No  distress.  HENT:  Head: Normocephalic and atraumatic.  Eyes: Pupils are equal, round, and reactive to light. Conjunctivae are normal.  Neck: Normal range of motion.  Cardiovascular: Normal rate, regular rhythm, normal heart sounds and intact distal pulses.  Pulses:      Radial pulses are 2+ on the right side, and 2+ on the left side.  Hard to palpate pedal pulses due to body habitus and edema however her bilateral feet are warm.  Pulmonary/Chest: Effort normal and breath sounds normal.  Musculoskeletal: Normal range of motion. She exhibits edema (Mild to moderate nonpitting edema noted bilaterally).  Neurological: She is alert and oriented to person, place, and time.  Skin: She is not diaphoretic.  Minimal less than 1 cm scattered varicosities to the bilateral lower extremity.  There is no skin thickening or stasis dermatitis bilaterally.  Psychiatric: She has a normal mood and affect. Her behavior is normal. Judgment and thought content normal.  Vitals reviewed.  BP (!) 143/88 (BP Location: Left Arm, Patient Position: Sitting)   Pulse 81   Resp 18   Ht 5\' 7"  (1.702 m)   Wt 289 lb (131.1 kg)   BMI 45.26 kg/m   Past Medical History:  Diagnosis Date  . Diabetes mellitus without complication ([place])   . Hypertension   . Kidney stones    Social History   Socioeconomic History  . Marital status: Married    Spouse name: Not on file  . Number of children: Not on file  . Years of education: Not on file  . Highest education level:  Not on file  Occupational History  . Not on file  Social Needs  . Financial resource strain: Not on file  . Food insecurity:    Worry: Not on file    Inability: Not on file  . Transportation needs:    Medical: Not on file    Non-medical: Not on file  Tobacco Use  . Smoking status: Never Smoker  . Smokeless tobacco: Never Used  Substance and Sexual Activity  . Alcohol use: No  . Drug use: No  . Sexual activity: Not on file  Lifestyle  . Physical  activity:    Days per week: Not on file    Minutes per session: Not on file  . Stress: Not on file  Relationships  . Social connections:    Talks on phone: Not on file    Gets together: Not on file    Attends religious service: Not on file    Active member of club or organization: Not on file    Attends meetings of clubs or organizations: Not on file    Relationship status: Not on file  . Intimate partner violence:    Fear of current or ex partner: Not on file    Emotionally abused: Not on file    Physically abused: Not on file    Forced sexual activity: Not on file  Other Topics Concern  . Not on file  Social History Narrative  . Not on file   Past Surgical History:  Procedure Laterality Date  . LITHOTRIPSY     Family History  Problem Relation Age of Onset  . Diabetes Maternal Grandmother    Allergies  Allergen Reactions  . Levaquin [Levofloxacin In D5w] Swelling      Assessment & Plan:  Patient presents to review vascular studies.  The patient was last seen on February 01, 2017 and evaluation of bilateral lower extremity edema and discomfort.  Since her initial visit, the patient has been engaging in conservative therapy including wearing medical grade 1 compression socks, elevating her legs and remaining active with minimal improvement in her symptoms.  The patient has symptoms have progressed to the point that she is unable to function on a daily basis.  The patient feels that her discomfort has become lifestyle limiting.  The patient underwent a bilateral venous reflux exam which was notable for abnormal reflux times noted to the right lower extremity in the great saphenous vein at the groin and an accessory branch coming off of a previously ablated right great saphenous vein.  The patient was found to have abnormal reflux times in the left lower extremity to the great saphenous vein for approximately mid thigh to the proximal calf and small saphenous vein at the level of the  saphenous popliteal junction.  There is no evidence of deep vein or superficial thrombophlebitis to the bilateral lower extremity.  The patient denies any fever, nausea or vomiting.  1. Chronic venous insufficiency - New Patient with reflux noted to an accessory branch off of a previously ablated right greater saphenous vein.  The patient would benefit from foam sclerotherapy into this area. The patient was noted to have reflux to the left great saphenous vein from approximately mid thigh to proximal calf.  The patient would benefit from endovenous laser ablation to this area. Over the last 3 months the patient has been engaging in conservative therapy including wearing medical grade 1 compression stockings and elevating her legs with minimal improvement to her symptoms.  The patient's  symptoms have now become lifestyle limiting.  The patient notes that her discomfort has now become lifestyle limiting. I will applied to the patient's insurance We will call the patient with insurance approval  2. Lymphedema - New The patient is to continue engaging in conservative therapy I will be applying for foam sclerotherapy to the right lower extremity and laser ablation to the left lower extremity The patient may be a candidate for the addition therapy of a lymphedema pump in the future if I cannot continue to improve her symptoms  Current Outpatient Medications on File Prior to Visit  Medication Sig Dispense Refill  . albuterol (PROVENTIL HFA) 108 (90 Base) MCG/ACT inhaler 2 puffs q.i.d. p.r.n. short of breath, wheezing, or cough    . aspirin EC 81 MG tablet Take by mouth.    . fluticasone (FLONASE) 50 MCG/ACT nasal spray Place into the nose.    Marland Kitchen LANTUS SOLOSTAR 100 UNIT/ML Solostar Pen Inject 40 Units into the skin at bedtime.   0  . metFORMIN (GLUCOPHAGE) 500 MG tablet Take 500 mg by mouth 2 (two) times daily with a meal.  0  . methyldopa (ALDOMET) 500 MG tablet Take 1 tablet by mouth 3 (three) times  daily.  1  . NOVOLOG FLEXPEN 100 UNIT/ML FlexPen Inject 8-19 Units into the skin 3 (three) times daily with meals. 14 am, 16 lunch, 24 dinner  0  . quinapril-hydrochlorothiazide (ACCURETIC) 20-12.5 MG tablet   0   No current facility-administered medications on file prior to visit.    There are no Patient Instructions on file for this visit. No follow-ups on file.  Jadee Golebiewski A Carle Dargan, PA-C

## 2017-05-11 ENCOUNTER — Telehealth (INDEPENDENT_AMBULATORY_CARE_PROVIDER_SITE_OTHER): Payer: Self-pay | Admitting: Vascular Surgery

## 2017-05-11 NOTE — Telephone Encounter (Addendum)
Patient said that her leg right leg is swollen and double the size of her left leg. Patient was offered and appointment for Monday but declined.

## 2017-05-11 NOTE — Telephone Encounter (Signed)
Receptionist handle it

## 2017-06-05 ENCOUNTER — Telehealth (INDEPENDENT_AMBULATORY_CARE_PROVIDER_SITE_OTHER): Payer: Self-pay | Admitting: Vascular Surgery

## 2017-06-05 NOTE — Telephone Encounter (Signed)
Find out what the patient is talking about and needs, Maudie Mercury doesn't just call patient's unless its necessary.

## 2017-06-19 ENCOUNTER — Ambulatory Visit (INDEPENDENT_AMBULATORY_CARE_PROVIDER_SITE_OTHER): Payer: BC Managed Care – PPO | Admitting: Vascular Surgery

## 2017-06-26 DIAGNOSIS — R9431 Abnormal electrocardiogram [ECG] [EKG]: Secondary | ICD-10-CM | POA: Insufficient documentation

## 2017-06-26 DIAGNOSIS — R0602 Shortness of breath: Secondary | ICD-10-CM | POA: Insufficient documentation

## 2017-07-23 ENCOUNTER — Ambulatory Visit (INDEPENDENT_AMBULATORY_CARE_PROVIDER_SITE_OTHER): Payer: BC Managed Care – PPO | Admitting: Vascular Surgery

## 2017-09-10 ENCOUNTER — Ambulatory Visit: Payer: BC Managed Care – PPO | Admitting: Podiatry

## 2017-09-10 ENCOUNTER — Encounter: Payer: Self-pay | Admitting: Podiatry

## 2017-09-10 DIAGNOSIS — L600 Ingrowing nail: Secondary | ICD-10-CM | POA: Diagnosis not present

## 2017-09-10 MED ORDER — NEOMYCIN-POLYMYXIN-HC 3.5-10000-1 OT SOLN: OTIC | 0 refills | Status: AC

## 2017-09-10 NOTE — Patient Instructions (Addendum)

## 2017-09-10 NOTE — Progress Notes (Signed)
  Subjective:  Patient ID: Caitlin Blackburn, female    DOB: March 30, 1975,  MRN: [id number] HPI Chief Complaint  Patient presents with  . Nail Problem    Patient presents today for ingrown toenail left hallux medial border x 3 months.  She reports it is getting red and tender to touch at the tip of her toe.  She has been using neosporin to treat it     42 y.o. female presents with the above complaint.   ROS: Denies fever chills nausea vomiting muscle aches pains calf pain back pain chest pain shortness of breath.  Past Medical History:  Diagnosis Date  . Diabetes mellitus without complication ([place])   . Hypertension   . Kidney stones    Past Surgical History:  Procedure Laterality Date  . LITHOTRIPSY      Current Outpatient Medications:  .  aspirin EC 81 MG tablet, Take by mouth., Disp: , Rfl:  .  LANTUS SOLOSTAR 100 UNIT/ML Solostar Pen, Inject 40 Units into the skin at bedtime. , Disp: , Rfl: 0 .  NOVOLOG FLEXPEN 100 UNIT/ML FlexPen, Inject 8-19 Units into the skin 3 (three) times daily with meals. 14 am, 16 lunch, 24 dinner, Disp: , Rfl: 0 .  quinapril-hydrochlorothiazide (ACCURETIC) 20-12.5 MG tablet, , Disp: , Rfl: 0  Allergies  Allergen Reactions  . Levaquin [Levofloxacin In D5w] Swelling   Review of Systems Objective:  There were no vitals filed for this visit.  General: Well developed, nourished, in no acute distress, alert and oriented x3   Dermatological: Skin is warm, dry and supple bilateral. Nails x 10 are well maintained; remaining integument appears unremarkable at this time. There are no open sores, no preulcerative lesions, no rash or signs of infection present.  Sharp incurvated nail margins with erythema edema and purulence tibial border hallux left.  Exquisitely tender on palpation.  Vascular: Dorsalis Pedis artery and Posterior Tibial artery pedal pulses are 2/4 bilateral with immedate capillary fill time. Pedal hair growth present. No varicosities and no lower  extremity edema present bilateral.   Neruologic: Grossly intact via light touch bilateral. Vibratory intact via tuning fork bilateral. Protective threshold with Semmes Wienstein monofilament intact to all pedal sites bilateral. Patellar and Achilles deep tendon reflexes 2+ bilateral. No Babinski or clonus noted bilateral.   Musculoskeletal: No gross boney pedal deformities bilateral. No pain, crepitus, or limitation noted with foot and ankle range of motion bilateral. Muscular strength 5/5 in all groups tested bilateral.  Gait: Unassisted, Nonantalgic.    Radiographs:  None taken  Assessment & Plan:   Assessment: Ingrown toenail hallux left tibial border  Plan: Matrixectomy was performed after local anesthesia was administered.  She tolerated procedure well without complications to the tibial border of the hallux left.  She was given both oral and written home-going instruction for the care and soaking of her toe as well as a prescription for Cortisporin Otic to be applied twice daily after soaking.  She will follow-up with me in 1 to 2 weeks.     Janett Kamath T. [place], [place]

## 2017-09-24 ENCOUNTER — Ambulatory Visit: Payer: BC Managed Care – PPO | Admitting: Podiatry

## 2017-09-24 ENCOUNTER — Encounter: Payer: Self-pay | Admitting: Podiatry

## 2017-09-24 DIAGNOSIS — L600 Ingrowing nail: Secondary | ICD-10-CM

## 2017-09-24 NOTE — Progress Notes (Signed)
She presents today for follow-up of matrixectomy hallux left tibial border.  States that is doing very well denies fever chills nausea vomiting muscle aches pains.  She states that she continues to soak daily.  Objective: Vital signs are stable alert and oriented x3 there is no erythema edema cellulitis drainage or odor.  Pulses are palpable.  Neurologic sensorium is intact.  Degenerative flexors are intact.  Assessment: Toe appears to be healing well.  Plan: Continue to soak every other day Epsom salts and warm water until completely resolved cover during the day leave open at bedtime.

## 2017-10-09 ENCOUNTER — Ambulatory Visit (INDEPENDENT_AMBULATORY_CARE_PROVIDER_SITE_OTHER): Payer: BC Managed Care – PPO | Admitting: Vascular Surgery

## 2017-12-12 ENCOUNTER — Encounter

## 2017-12-12 ENCOUNTER — Ambulatory Visit (INDEPENDENT_AMBULATORY_CARE_PROVIDER_SITE_OTHER): Payer: BC Managed Care – PPO

## 2017-12-12 ENCOUNTER — Encounter: Payer: Self-pay | Admitting: Podiatry

## 2017-12-12 ENCOUNTER — Ambulatory Visit: Payer: BC Managed Care – PPO | Admitting: Podiatry

## 2017-12-12 DIAGNOSIS — M722 Plantar fascial fibromatosis: Secondary | ICD-10-CM

## 2017-12-12 DIAGNOSIS — M7661 Achilles tendinitis, right leg: Secondary | ICD-10-CM

## 2017-12-12 MED ORDER — MELOXICAM 15 MG PO TABS: 15.0000 mg | ORAL_TABLET | Freq: Every day | ORAL | 3 refills | Status: AC

## 2017-12-12 MED ORDER — METHYLPREDNISOLONE 4 MG PO TBPK: ORAL_TABLET | ORAL | 0 refills | Status: AC

## 2017-12-12 NOTE — Patient Instructions (Signed)

## 2017-12-12 NOTE — Progress Notes (Signed)
She presents today for a follow-up of her right ankle pain x1 month she states that is really bothering her and is sharp pain.  Objective: Vital signs stable alert and oriented x3 has severe pain on palpation of the plantar fascia as well as the posterior aspect of the calcaneus at the insertion site of the Achilles.  There is mild erythema no edema cellulitis drainage or odor.  Assessment: Achilles tendinitis.  Plan: After sterile Betadine skin prep injected 2 mg dexamethasone local anesthetic to the point of maximal tenderness making sure not to inject into the Achilles tendon.  Placed her on methylprednisolone and meloxicam.  And in a night splint.

## 2018-01-16 ENCOUNTER — Ambulatory Visit: Payer: BC Managed Care – PPO | Admitting: Podiatry

## 2018-02-14 ENCOUNTER — Other Ambulatory Visit: Payer: Self-pay | Admitting: Urology

## 2018-02-14 DIAGNOSIS — R1011 Right upper quadrant pain: Secondary | ICD-10-CM

## 2018-02-19 ENCOUNTER — Ambulatory Visit: Payer: BC Managed Care – PPO

## 2018-02-20 ENCOUNTER — Ambulatory Visit
Admission: RE | Admit: 2018-02-20 | Discharge: 2018-02-20 | Disposition: A | Discharge status: Home / Self Care | Payer: BC Managed Care – PPO | Source: Ambulatory Visit | Attending: Urology | Admitting: Urology

## 2018-02-20 DIAGNOSIS — R1011 Right upper quadrant pain: Secondary | ICD-10-CM | POA: Insufficient documentation

## 2018-03-04 ENCOUNTER — Other Ambulatory Visit: Payer: Self-pay | Admitting: Internal Medicine

## 2018-03-11 ENCOUNTER — Other Ambulatory Visit: Payer: Self-pay | Admitting: Obstetrics and Gynecology

## 2018-03-11 DIAGNOSIS — Z1231 Encounter for screening mammogram for malignant neoplasm of breast: Secondary | ICD-10-CM

## 2018-03-25 ENCOUNTER — Ambulatory Visit
Admission: RE | Admit: 2018-03-25 | Discharge: 2018-03-25 | Disposition: A | Discharge status: Home / Self Care | Payer: BC Managed Care – PPO | Source: Ambulatory Visit | Attending: Obstetrics and Gynecology | Admitting: Obstetrics and Gynecology

## 2018-03-25 DIAGNOSIS — Z1231 Encounter for screening mammogram for malignant neoplasm of breast: Secondary | ICD-10-CM | POA: Diagnosis not present

## 2019-04-08 ENCOUNTER — Other Ambulatory Visit: Payer: Self-pay | Admitting: Obstetrics and Gynecology

## 2019-04-08 DIAGNOSIS — Z1231 Encounter for screening mammogram for malignant neoplasm of breast: Secondary | ICD-10-CM

## 2019-04-30 ENCOUNTER — Ambulatory Visit
Admission: RE | Admit: 2019-04-30 | Discharge: 2019-04-30 | Disposition: A | Discharge status: Home / Self Care | Payer: BC Managed Care – PPO | Source: Ambulatory Visit | Attending: Obstetrics and Gynecology | Admitting: Obstetrics and Gynecology

## 2019-04-30 DIAGNOSIS — Z1231 Encounter for screening mammogram for malignant neoplasm of breast: Secondary | ICD-10-CM | POA: Diagnosis present

## 2020-05-11 ENCOUNTER — Other Ambulatory Visit: Payer: Self-pay

## 2020-05-11 ENCOUNTER — Ambulatory Visit: Payer: BC Managed Care – PPO | Admitting: Dermatology

## 2020-05-11 DIAGNOSIS — L409 Psoriasis, unspecified: Secondary | ICD-10-CM | POA: Diagnosis not present

## 2020-05-11 MED ORDER — BRYHALI 0.01 % EX LOTN: TOPICAL_LOTION | CUTANEOUS | 2 refills | Status: AC

## 2020-05-11 MED ORDER — OTEZLA 30 MG PO TABS: 30.0000 mg | ORAL_TABLET | Freq: Two times a day (BID) | ORAL | 12 refills | Status: AC

## 2020-05-11 MED ORDER — OTEZLA 30 MG PO TABS: 30.0000 mg | ORAL_TABLET | Freq: Two times a day (BID) | ORAL | 5 refills | Status: AC

## 2020-05-11 MED ORDER — OTEZLA 10 & 20 & 30 MG PO TBPK: ORAL_TABLET | ORAL | 0 refills | Status: AC

## 2020-05-11 NOTE — Patient Instructions (Addendum)
Vanicream HC and CeraVe Ointment - Apply to affected areas face/ears 1-2 times a day until improved.   AmLactin - Use daily for a moisturizer.  Side effects of Otezla (apremilast) include diarrhea, nausea, headache, upper respiratory infection, depression, and weight decrease (5-10%). It should only be taken by pregnant women after a discussion regarding risks and benefits with their doctor. Goal is control of skin condition, not cure.  The use of Rutherford Nail requires long term medication management, including periodic office visits.  Topical steroids (such as triamcinolone, fluocinolone, fluocinonide, mometasone, clobetasol, halobetasol, betamethasone, hydrocortisone) can cause thinning and lightening of the skin if they are used for too long in the same area. Your physician has selected the right strength medicine for your problem and area affected on the body. Please use your medication only as directed by your physician to prevent side effects.    If you have any questions or concerns for your doctor, please call our main line at [phone] and press option 4 to reach your doctor's medical assistant. If no one answers, please leave a voicemail as directed and we will return your call as soon as possible. Messages left after 4 pm will be answered the following business day.   You may also send [place] a message via [place]. We typically respond to MyChart messages within 1-2 business days.  For prescription refills, please ask your pharmacy to contact our office. Our fax number is [phone].  If you have an urgent issue when the clinic is closed that cannot wait until the next business day, you can page your doctor at the number below.    Please note that while we do our best to be available for urgent issues outside of office hours, we are not available 24/7.   If you have an urgent issue and are unable to reach [place], you may choose to seek medical care at your doctor's office, retail clinic, urgent  care center, or emergency room.  If you have a medical emergency, please immediately call 911 or go to the emergency department.  Pager Numbers  - Dr. Nehemiah Massed: [phone]  - Dr. Laurence Ferrari: [phone]  - Dr. Nicole Kindred: [phone]  In the event of inclement weather, please call our main line at [phone] for an update on the status of any delays or closures.  Dermatology Medication Tips: Please keep the boxes that topical medications come in in order to help keep track of the instructions about where and how to use these. Pharmacies typically print the medication instructions only on the boxes and not directly on the medication tubes.   If your medication is too expensive, please contact our office at [phone] option 4 or send [place] a message through [place].   We are unable to tell what your co-pay for medications will be in advance as this is different depending on your insurance coverage. However, we may be able to find a substitute medication at lower cost or fill out paperwork to get insurance to cover a needed medication.   If a prior authorization is required to get your medication covered by your insurance company, please allow [place] 1-2 business days to complete this process.  Drug prices often vary depending on where the prescription is filled and some pharmacies may offer cheaper prices.  The website www.goodrx.com contains coupons for medications through different pharmacies. The prices here do not account for what the cost may be with help from insurance (it may be cheaper with your insurance), but the website can give  you the price if you did not use any insurance.  - You can print the associated coupon and take it with your prescription to the pharmacy.  - You may also stop by our office during regular business hours and pick up a GoodRx coupon card.  - If you need your prescription sent electronically to a different pharmacy, notify our office through [facility] or  by phone at [phone] option 4.

## 2020-05-11 NOTE — Progress Notes (Signed)
   Follow-Up Visit   Subjective  Caitlin Blackburn is a 45 y.o. female who presents for the following: Rash (Patient here for a rash that started at the end of February on the face. Rash then spread to arms, back, and right thigh. Rash is very itchy. She saw her PCP on 05/06/20 and was prescribed betamethasone dipropionate cream and Bactrim DS. Rash hasn't improved any. ). No family history of psoriasis. No joint pain.  No sore throat or illness prior to rash. She did have Covid early 2020/03/22 and her father-in-law passed away the end of 03-22-22.   The following portions of the chart were reviewed this encounter and updated as appropriate:       Review of Systems:  No other skin or systemic complaints except as noted in HPI or Assessment and Plan.  Objective  Well appearing patient in no apparent distress; mood and affect are within normal limits.  A focused examination was performed including face, trunk, extremities. Relevant physical exam findings are noted in the Assessment and Plan.  Objective  face, arms, right leg, back: Guttate pink scaly papules and small plaques on the arms, hands, back, R thigh, chest; pink scaliness of the eyebrows and ears.   Assessment & Plan  Psoriasis face, arms, right leg, back  Guttate and plaque morphology.  Possible flare post Covid infection.  Discussed NBUVB treatments. Pt may start if affordable.  Start Bryhali lotion Apply to AA rash qd/bid until improved dsp 100g 2Rf.  Start [place] - sample packs given. Starter, Bridge, and Maintenance Rxs sent to [employer].   May use 1% Hydrocortisone Cream to AA face and ears until improved. Sample of Vanicream HC.  Recommend mild soap and moisturizing cream 1-2 times daily.   AmLactin samples given.  Psoriasis is a chronic non-curable, but treatable genetic/hereditary disease that may have other systemic features affecting other organ systems such as joints (Psoriatic Arthritis). It is associated with an  increased risk of inflammatory bowel disease, heart disease, non-alcoholic fatty liver disease, and depression.     Topical steroids (such as triamcinolone, fluocinolone, fluocinonide, mometasone, clobetasol, halobetasol, betamethasone, hydrocortisone) can cause thinning and lightening of the skin if they are used for too long in the same area. Your physician has selected the right strength medicine for your problem and area affected on the body. Please use your medication only as directed by your physician to prevent side effects.    Side effects of Otezla (apremilast) include diarrhea, nausea, headache, upper respiratory infection, depression, and weight decrease (5-10%). It should only be taken by pregnant women after a discussion regarding risks and benefits with their doctor. Goal is control of skin condition, not cure.  The use of Rutherford Nail requires long term medication management, including periodic office visits.   Halobetasol Propionate (BRYHALI) 0.01 % LOTN - face, arms, right leg, back  Apremilast (OTEZLA) 10 & 20 & 30 MG TBPK - face, arms, right leg, back  Apremilast (OTEZLA) 30 MG TABS - face, arms, right leg, back  Apremilast (OTEZLA) 30 MG TABS - face, arms, right leg, back  Return in about 1 month (around 06/10/2020) for psoriasis.  IJamesetta Orleans, CMA, am acting as scribe for Brendolyn Patty, MD .  Documentation: I have reviewed the above documentation for accuracy and completeness, and I agree with the above.  Brendolyn Patty MD

## 2020-06-14 ENCOUNTER — Ambulatory Visit: Payer: BC Managed Care – PPO | Admitting: Dermatology

## 2020-12-24 ENCOUNTER — Other Ambulatory Visit: Payer: Self-pay | Admitting: Obstetrics and Gynecology

## 2020-12-24 DIAGNOSIS — Z1231 Encounter for screening mammogram for malignant neoplasm of breast: Secondary | ICD-10-CM

## 2021-03-14 ENCOUNTER — Other Ambulatory Visit: Payer: Self-pay

## 2021-03-14 ENCOUNTER — Ambulatory Visit: Payer: BC Managed Care – PPO | Admitting: Dermatology

## 2021-03-14 DIAGNOSIS — D485 Neoplasm of uncertain behavior of skin: Secondary | ICD-10-CM

## 2021-03-14 DIAGNOSIS — I872 Venous insufficiency (chronic) (peripheral): Secondary | ICD-10-CM

## 2021-03-14 DIAGNOSIS — C44722 Squamous cell carcinoma of skin of right lower limb, including hip: Secondary | ICD-10-CM

## 2021-03-14 DIAGNOSIS — C4492 Squamous cell carcinoma of skin, unspecified: Secondary | ICD-10-CM

## 2021-03-14 HISTORY — DX: Squamous cell carcinoma of skin, unspecified: C44.92

## 2021-03-14 MED ORDER — MUPIROCIN 2 % EX OINT: 1.0000 "application " | TOPICAL_OINTMENT | Freq: Every day | CUTANEOUS | 0 refills | Status: AC

## 2021-03-14 NOTE — Progress Notes (Signed)
° °  Follow-Up Visit   Subjective  Caitlin Blackburn is a 46 y.o. female who presents for the following: Skin Problem (Patient with a spot at right lower leg, present for > 1 month. Patient hits it when shaving. ).  It is tender to touch.  She uses a tanning bed.   The following portions of the chart were reviewed this encounter and updated as appropriate:       Review of Systems:  No other skin or systemic complaints except as noted in HPI or Assessment and Plan.  Objective  Well appearing patient in no apparent distress; mood and affect are within normal limits.  A focused examination was performed including right lower leg. Relevant physical exam findings are noted in the Assessment and Plan.  right pretibia 0.7 cm pink keratotic papule       lower legs Mild erythema, pitting edema    Assessment & Plan  Neoplasm of uncertain behavior of skin right pretibia  Epidermal / dermal shaving  Lesion diameter (cm):  0.7 Informed consent: discussed and consent obtained   Patient was prepped and draped in usual sterile fashion: Area prepped with alcohol. Anesthesia: the lesion was anesthetized in a standard fashion   Anesthetic:  1% lidocaine w/ epinephrine 1-100,000 buffered w/ 8.4% NaHCO3 Instrument used: flexible razor blade   Hemostasis achieved with: pressure, aluminum chloride and electrodesiccation   Outcome: patient tolerated procedure well   Post-procedure details: wound care instructions given   Post-procedure details comment:  Ointment and small bandage applied.   mupirocin ointment (BACTROBAN) 2 % Apply 1 application topically daily. With dressing changes  Specimen 1 - Surgical pathology Differential Diagnosis: Wart vs Dermatofibroma r/o AK/SCC  Check Margins: No 0.7 cm pink keratotic papule  If SCC, recommend EDC  Discussed resulting small scar with shave removal, and possible recurrence of lesion.  Recommend mupirocin ointment to area daily and cover until  healed. Recommend photoprotection/sunscreen to area to prevent discoloration of scar.  Once healed, may apply OTC Serica scar gel bid to thickened scars.   She should use daily compression on R lower leg to help with healing. Pt is also diabetic which will slow down wound healing.  Venous stasis dermatitis of right lower extremity lower legs  Continue lymphedema pumps at home as directed Continue  daily compression hose  Stasis in the legs causes chronic leg swelling, which may result in itchy or painful rashes, skin discoloration, skin texture changes, and sometimes ulceration.  Recommend daily graduated compression hose/stockings- easiest to put on first thing in morning, remove at bedtime.  Elevate legs as much as possible. Avoid salt/sodium rich foods.    Return if symptoms worsen or fail to improve, for pending bx results.  Graciella Belton, RMA, am acting as scribe for Brendolyn Patty, MD .  Documentation: I have reviewed the above documentation for accuracy and completeness, and I agree with the above.  Brendolyn Patty MD

## 2021-03-14 NOTE — Patient Instructions (Signed)

## 2021-03-15 ENCOUNTER — Telehealth: Payer: Self-pay

## 2021-03-15 NOTE — Telephone Encounter (Signed)
-----   Message from Brendolyn Patty, MD sent at 03/15/2021  5:13 PM EST ----- Skin , right pretibia WELL DIFFERENTIATED SQUAMOUS CELL CARCINOMA  SCC skin cancer, needs EDC  - please call patient

## 2021-03-15 NOTE — Telephone Encounter (Signed)
Advised pt of bx results and scheduled pt for [facility] on 03/23/21./sh

## 2021-03-23 ENCOUNTER — Ambulatory Visit: Payer: BC Managed Care – PPO | Admitting: Dermatology

## 2021-03-23 ENCOUNTER — Other Ambulatory Visit: Payer: Self-pay

## 2021-03-23 ENCOUNTER — Encounter: Payer: Self-pay | Admitting: Dermatology

## 2021-03-23 DIAGNOSIS — L578 Other skin changes due to chronic exposure to nonionizing radiation: Secondary | ICD-10-CM

## 2021-03-23 DIAGNOSIS — C4492 Squamous cell carcinoma of skin, unspecified: Secondary | ICD-10-CM

## 2021-03-23 DIAGNOSIS — I872 Venous insufficiency (chronic) (peripheral): Secondary | ICD-10-CM | POA: Diagnosis not present

## 2021-03-23 DIAGNOSIS — C44722 Squamous cell carcinoma of skin of right lower limb, including hip: Secondary | ICD-10-CM

## 2021-03-23 NOTE — Patient Instructions (Signed)
Wound Care Instructions  Cleanse wound gently with soap and water once a day then pat dry with clean gauze. Apply a thing coat of Petrolatum (petroleum jelly, "Vaseline") over the wound (unless you have an allergy to this). We recommend that you use a new, sterile tube of Vaseline. Do not pick or remove scabs. Do not remove the yellow or white "healing tissue" from the base of the wound.  Cover the wound with fresh, clean, nonstick gauze and secure with paper tape. You may use Band-Aids in place of gauze and tape if the would is small enough, but would recommend trimming much of the tape off as there is often too much. Sometimes Band-Aids can irritate the skin.  If you experience any problems, such as abnormal amounts of bleeding, swelling, significant bruising, significant pain, or evidence of infection, please call the office immediately.  FOR ADULT SURGERY PATIENTS: If you need something for pain relief you may take 1 extra strength Tylenol (acetaminophen) AND 2 Ibuprofen (200mg  each) together every 4 hours as needed for pain. (do not take these if you are allergic to them or if you have a reason you should not take them.) Typically, you may only need pain medication for 1 to 3 days.    If You Need Anything After Your Visit  If you have any questions or concerns for your doctor, please call our main line at [phone] and press option 4 to reach your doctor's medical assistant. If no one answers, please leave a voicemail as directed and we will return your call as soon as possible. Messages left after 4 pm will be answered the following business day.   You may also send [place] a message via [place]. We typically respond to MyChart messages within 1-2 business days.  For prescription refills, please ask your pharmacy to contact our office. Our fax number is [phone].  If you have an urgent issue when the clinic is closed that cannot wait until the next business day, you can page your doctor at  the number below.    Please note that while we do our best to be available for urgent issues outside of office hours, we are not available 24/7.   If you have an urgent issue and are unable to reach [place], you may choose to seek medical care at your doctor's office, retail clinic, urgent care center, or emergency room.  If you have a medical emergency, please immediately call 911 or go to the emergency department.  Pager Numbers  - Dr. Nehemiah Massed: [phone]  - Dr. Laurence Ferrari: [phone]  - Dr. Nicole Kindred: [phone]  In the event of inclement weather, please call our main line at [phone] for an update on the status of any delays or closures.  Dermatology Medication Tips: Please keep the boxes that topical medications come in in order to help keep track of the instructions about where and how to use these. Pharmacies typically print the medication instructions only on the boxes and not directly on the medication tubes.   If your medication is too expensive, please contact our office at [phone] option 4 or send [place] a message through [place].   We are unable to tell what your co-pay for medications will be in advance as this is different depending on your insurance coverage. However, we may be able to find a substitute medication at lower cost or fill out paperwork to get insurance to cover a needed medication.   If a prior authorization is required to get  your medication covered by your insurance company, please allow [place] 1-2 business days to complete this process.  Drug prices often vary depending on where the prescription is filled and some pharmacies may offer cheaper prices.  The website www.goodrx.com contains coupons for medications through different pharmacies. The prices here do not account for what the cost may be with help from insurance (it may be cheaper with your insurance), but the website can give you the price if you did not use any insurance.  - You can print the  associated coupon and take it with your prescription to the pharmacy.  - You may also stop by our office during regular business hours and pick up a GoodRx coupon card.  - If you need your prescription sent electronically to a different pharmacy, notify our office through [facility] or by phone at [phone] option 4.     Si Usted Necesita Algo Despus de Su Visita  Tambin puede enviarnos un mensaje a travs de Pharmacist, community. Por lo general respondemos a los mensajes de MyChart en el transcurso de 1 a 2 das hbiles.  Para renovar recetas, por favor pida a su farmacia que se ponga en contacto con nuestra oficina. Harland Dingwall de fax es [place] [phone].  Si tiene un asunto urgente cuando la clnica est cerrada y que no puede esperar hasta el siguiente da hbil, puede llamar/localizar a su doctor(a) al nmero que aparece a continuacin.   Por favor, tenga en cuenta que aunque hacemos todo lo posible para estar disponibles para asuntos urgentes fuera del horario de [place], no estamos disponibles las 24 horas del da, los 7 das de la [place].   Si tiene un problema urgente y no puede comunicarse con nosotros, puede optar por buscar atencin mdica  en el consultorio de su doctor(a), en una clnica privada, en un centro de atencin urgente o en una sala de emergencias.  Si tiene Engineering geologist, por favor llame inmediatamente al 911 o vaya a la sala de emergencias.  Nmeros de bper  - Dr. Nehemiah Massed: [phone]  - Dra. Moye: [phone]  - Dra. Nicole Kindred: [phone]  En caso de inclemencias del [place], por favor llame a Johnsie Kindred principal al [phone] para una actualizacin sobre el [place] de cualquier retraso o cierre.  Consejos para la medicacin en dermatologa: Por favor, guarde las cajas en las que vienen los medicamentos de uso tpico para ayudarle a seguir las instrucciones sobre dnde y cmo usarlos. Las farmacias generalmente imprimen las instrucciones  del medicamento slo en las cajas y no directamente en los tubos del [place].   Si su medicamento es muy caro, por favor, pngase en contacto con Zigmund Daniel llamando al [phone] y presione la opcin 4 o envenos un mensaje a travs de Pharmacist, community.   No podemos decirle cul ser su copago por los medicamentos por adelantado ya que esto es diferente dependiendo de la cobertura de su seguro. Sin embargo, es posible que podamos encontrar un medicamento sustituto a Electrical engineer un formulario para que el seguro cubra el medicamento que se considera necesario.   Si se requiere una autorizacin previa para que su compaa de seguros [place] su medicamento, por favor permtanos de 1 a 2 das hbiles para completar este proceso.  Los precios de los medicamentos varan con frecuencia dependiendo del Environmental consultant de dnde se surte la receta y alguna farmacias pueden ofrecer precios ms baratos.  El sitio web www.goodrx.com tiene cupones para medicamentos de Airline pilot. Los  precios aqu no tienen en cuenta lo que podra costar con la ayuda del seguro (puede ser ms barato con su seguro), pero el sitio web puede darle el precio si no utiliz Research scientist (physical sciences).  - Puede imprimir el cupn correspondiente y llevarlo con su receta a la farmacia.  - Tambin puede pasar por nuestra oficina durante el horario de atencin regular y Charity fundraiser una tarjeta de cupones de GoodRx.  - Si necesita que su receta se enve electrnicamente a una farmacia diferente, informe a nuestra oficina a travs de MyChart de  o por telfono llamando al [phone] y presione la opcin 4.

## 2021-03-23 NOTE — Progress Notes (Signed)
° °  Follow-Up Visit   Subjective  Caitlin Blackburn is a 46 y.o. female who presents for the following: Squamous Cell Carcinoma (Right pretibia. Patient presents for [facility].).    The following portions of the chart were reviewed this encounter and updated as appropriate:       Review of Systems:  No other skin or systemic complaints except as noted in HPI or Assessment and Plan.  Objective  Well appearing patient in no apparent distress; mood and affect are within normal limits.  A focused examination was performed including right leg. Relevant physical exam findings are noted in the Assessment and Plan.  Right pretibia Pink, crusted biopsy site  lower legs Erythema and edema    Assessment & Plan  Actinic Damage - chronic, secondary to cumulative UV radiation exposure/sun exposure over time - diffuse scaly erythematous macules with underlying dyspigmentation - Recommend daily broad spectrum sunscreen SPF 30+ to sun-exposed areas, reapply every 2 hours as needed.  - Recommend staying in the shade or wearing long sleeves, sun glasses (UVA+UVB protection) and wide brim hats (4-inch brim around the entire circumference of the hat). - Call for new or changing lesions.  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  Destruction of lesion  Destruction method: electrodesiccation and curettage   Informed consent: discussed and consent obtained   Timeout:  patient name, date of birth, surgical site, and procedure verified Anesthesia: the lesion was anesthetized in a standard fashion   Anesthetic:  1% lidocaine w/ epinephrine 1-100,000 local infiltration Curettage performed in three different directions: Yes   Electrodesiccation performed over the curetted area: Yes   Final wound size (cm):  1 Hemostasis achieved with:  pressure, aluminum chloride and electrodesiccation Outcome: patient tolerated procedure well with no complications   Post-procedure details: wound care instructions  given   Post-procedure details comment:  Ointment and bandage applied.  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  Recommend daily graduated compression hose/stockings- easiest to put on first thing in morning, remove at bedtime.  Elevate legs as much as possible. Avoid salt/sodium rich foods.  Continue lymphedema pumps at home as directed Continue daily compression hose   Return in about 6 months (around 09/20/2021) for Hx SCC.  IJamesetta Orleans, CMA, am acting as scribe for Brendolyn Patty, MD .  Documentation: I have reviewed the above documentation for accuracy and completeness, and I agree with the above.  Brendolyn Patty MD

## 2021-03-25 ENCOUNTER — Ambulatory Visit
Admission: RE | Admit: 2021-03-25 | Discharge: 2021-03-25 | Disposition: A | Discharge status: Home / Self Care | Payer: BC Managed Care – PPO | Source: Ambulatory Visit | Attending: Obstetrics and Gynecology | Admitting: Obstetrics and Gynecology

## 2021-03-25 ENCOUNTER — Other Ambulatory Visit: Payer: Self-pay

## 2021-03-25 DIAGNOSIS — Z1231 Encounter for screening mammogram for malignant neoplasm of breast: Secondary | ICD-10-CM | POA: Diagnosis not present

## 2021-04-18 ENCOUNTER — Telehealth: Payer: Self-pay

## 2021-04-18 NOTE — Telephone Encounter (Addendum)
Called and advised patient of needing to schedule a psoriasis follow-up to continue her [place]. Patient stated that she is not taking that medication. She said that she was given samples in our office but that she thinks that the rash was related to something else and went away on its own.  ?

## 2021-04-18 NOTE — Telephone Encounter (Signed)
Working on an updated PA for her [place] Nail and they want office visit notes to show improvement/reduction in BSA from her baseline, but I do not see documentation where anything was discussed regarding her Psoriasis at her February visit? Please advise.  ?

## 2021-09-19 ENCOUNTER — Ambulatory Visit: Payer: BC Managed Care – PPO | Admitting: Dermatology

## 2021-12-27 ENCOUNTER — Ambulatory Visit: Payer: BC Managed Care – PPO | Admitting: Dermatology

## 2021-12-27 DIAGNOSIS — L578 Other skin changes due to chronic exposure to nonionizing radiation: Secondary | ICD-10-CM | POA: Diagnosis not present

## 2021-12-27 DIAGNOSIS — L814 Other melanin hyperpigmentation: Secondary | ICD-10-CM | POA: Diagnosis not present

## 2021-12-27 DIAGNOSIS — L820 Inflamed seborrheic keratosis: Secondary | ICD-10-CM | POA: Diagnosis not present

## 2021-12-27 DIAGNOSIS — L821 Other seborrheic keratosis: Secondary | ICD-10-CM

## 2021-12-27 DIAGNOSIS — I872 Venous insufficiency (chronic) (peripheral): Secondary | ICD-10-CM

## 2021-12-27 MED ORDER — MOMETASONE FUROATE 0.1 % EX CREA: TOPICAL_CREAM | CUTANEOUS | 1 refills | Status: AC

## 2021-12-27 NOTE — Progress Notes (Signed)
Follow-Up Visit   Subjective  Caitlin Blackburn is a 46 y.o. female who presents for the following: Skin Problem (Patient here today concerning a spot at lower left leg she nicks when shaving. She also reports some red discoloration at b/l lower legs she would like checked. ).  The patient has spots, moles and lesions to be evaluated, some may be new or changing and the patient has concerns that these could be cancer.  The following portions of the chart were reviewed this encounter and updated as appropriate:      Review of Systems: No other skin or systemic complaints except as noted in HPI or Assessment and Plan.   Objective  Well appearing patient in no apparent distress; mood and affect are within normal limits.  A focused examination was performed including b/l lower legs. Relevant physical exam findings are noted in the Assessment and Plan.  left lower leg Erythema with small pink scaly macule/papules on left lower pretibia   left lower pretibia x 1, right lower pretibia x 1 (2) Erythematous stuck-on, waxy papule   Assessment & Plan  Venous stasis dermatitis of left lower extremity left lower leg  Vs DSAP  Stasis in the legs causes chronic leg swelling, which may result in itchy or painful rashes, skin discoloration, skin texture changes, and sometimes ulceration.  Recommend daily graduated compression hose/stockings- easiest to put on first thing in morning, remove at bedtime.  Elevate legs as much as possible. Avoid salt/sodium rich foods.  Patient does use compression stockings and does use lymphedema pumps at legs nightly.  Start mometasone cream qd / bid as needed for irritated rash at leg up to 2 weeks.  Topical steroids (such as triamcinolone, fluocinolone, fluocinonide, mometasone, clobetasol, halobetasol, betamethasone, hydrocortisone) can cause thinning and lightening of the skin if they are used for too long in the same area. Your physician has selected the  right strength medicine for your problem and area affected on the body. Please use your medication only as directed by your physician to prevent side effects.    mometasone (ELOCON) 0.1 % cream - left lower leg Apply topically to affected area of left lower leg qd/bid for rash  Inflamed seborrheic keratosis (2) left lower pretibia x 1, right lower pretibia x 1  Symptomatic, irritating, patient would like treated.  Destruction of lesion - left lower pretibia x 1, right lower pretibia x 1  Destruction method: cryotherapy   Informed consent: discussed and consent obtained   Lesion destroyed using liquid nitrogen: Yes   Region frozen until ice ball extended beyond lesion: Yes   Outcome: patient tolerated procedure well with no complications   Post-procedure details: wound care instructions given   Additional details:  Prior to procedure, discussed risks of blister formation, small wound, skin dyspigmentation, or rare scar following cryotherapy. Recommend Vaseline ointment to treated areas while healing.    Seborrheic Keratoses - Stuck-on, waxy, tan-brown papules and/or plaques  - Benign-appearing - Discussed benign etiology and prognosis. - Observe - Call for any changes  Lentigines - Scattered tan macules - Due to sun exposure - Benign-appearing, observe - Recommend daily broad spectrum sunscreen SPF 30+ to sun-exposed areas, reapply every 2 hours as needed. - Call for any changes   Actinic Damage - chronic, secondary to cumulative UV radiation exposure/sun exposure over time - diffuse scaly erythematous macules with underlying dyspigmentation - Recommend daily broad spectrum sunscreen SPF 30+ to sun-exposed areas, reapply every 2 hours as needed.  -  Recommend staying in the shade or wearing long sleeves, sun glasses (UVA+UVB protection) and wide brim hats (4-inch brim around the entire circumference of the hat). - Call for new or changing lesions.  Return if symptoms worsen  or fail to improve. I, Ruthell Rummage, CMA, am acting as scribe for Brendolyn Patty, MD.  Documentation: I have reviewed the above documentation for accuracy and completeness, and I agree with the above.  Brendolyn Patty MD

## 2021-12-27 NOTE — Patient Instructions (Addendum)
Stasis in the legs causes chronic leg swelling, which may result in itchy or painful rashes, skin discoloration, skin texture changes, and sometimes ulceration.  Recommend daily graduated compression hose/stockings- easiest to put on first thing in morning, remove at bedtime.  Elevate legs as much as possible. Avoid salt/sodium rich foods.  Start mometasone cream - apply topically to affected area at left lower leg for rash 1 to 2 times daily.   Topical steroids (such as triamcinolone, fluocinolone, fluocinonide, mometasone, clobetasol, halobetasol, betamethasone, hydrocortisone) can cause thinning and lightening of the skin if they are used for too long in the same area. Your physician has selected the right strength medicine for your problem and area affected on the body. Please use your medication only as directed by your physician to prevent side effects.      Cryotherapy Aftercare  Wash gently with soap and water everyday.   Apply Vaseline and Band-Aid daily until healed.   Seborrheic Keratosis  What causes seborrheic keratoses? Seborrheic keratoses are harmless, common skin growths that first appear during adult life.  As time goes by, more growths appear.  Some people may develop a large number of them.  Seborrheic keratoses appear on both covered and uncovered body parts.  They are not caused by sunlight.  The tendency to develop seborrheic keratoses can be inherited.  They vary in color from skin-colored to gray, brown, or even black.  They can be either smooth or have a rough, warty surface.   Seborrheic keratoses are superficial and look as if they were stuck on the skin.  Under the microscope this type of keratosis looks like layers upon layers of skin.  That is why at times the top layer may seem to fall off, but the rest of the growth remains and re-grows.    Treatment Seborrheic keratoses do not need to be treated, but can easily be removed in the office.  Seborrheic keratoses  often cause symptoms when they rub on clothing or jewelry.  Lesions can be in the way of shaving.  If they become inflamed, they can cause itching, soreness, or burning.  Removal of a seborrheic keratosis can be accomplished by freezing, burning, or surgery. If any spot bleeds, scabs, or grows rapidly, please return to have it checked, as these can be an indication of a skin cancer.   Due to recent changes in healthcare laws, you may see results of your pathology and/or laboratory studies on MyChart before the doctors have had a chance to review them. We understand that in some cases there may be results that are confusing or concerning to you. Please understand that not all results are received at the same time and often the doctors may need to interpret multiple results in order to provide you with the best plan of care or course of treatment. Therefore, we ask that you please give [place] 2 business days to thoroughly review all your results before contacting the office for clarification. Should we see a critical lab result, you will be contacted sooner.   If You Need Anything After Your Visit  If you have any questions or concerns for your doctor, please call our main line at [phone] and press option 4 to reach your doctor's medical assistant. If no one answers, please leave a voicemail as directed and we will return your call as soon as possible. Messages left after 4 pm will be answered the following business day.   You may also send [place] a message via  MyChart. We typically respond to MyChart messages within 1-2 business days.  For prescription refills, please ask your pharmacy to contact our office. Our fax number is [phone].  If you have an urgent issue when the clinic is closed that cannot wait until the next business day, you can page your doctor at the number below.    Please note that while we do our best to be available for urgent issues outside of office hours, we are not available  24/7.   If you have an urgent issue and are unable to reach [place], you may choose to seek medical care at your doctor's office, retail clinic, urgent care center, or emergency room.  If you have a medical emergency, please immediately call 911 or go to the emergency department.  Pager Numbers  - Dr. Nehemiah Massed: [phone]  - Dr. Laurence Ferrari: [phone]  - Dr. Nicole Kindred: [phone]  In the event of inclement weather, please call our main line at [phone] for an update on the status of any delays or closures.  Dermatology Medication Tips: Please keep the boxes that topical medications come in in order to help keep track of the instructions about where and how to use these. Pharmacies typically print the medication instructions only on the boxes and not directly on the medication tubes.   If your medication is too expensive, please contact our office at [phone] option 4 or send [place] a message through [place].   We are unable to tell what your co-pay for medications will be in advance as this is different depending on your insurance coverage. However, we may be able to find a substitute medication at lower cost or fill out paperwork to get insurance to cover a needed medication.   If a prior authorization is required to get your medication covered by your insurance company, please allow [place] 1-2 business days to complete this process.  Drug prices often vary depending on where the prescription is filled and some pharmacies may offer cheaper prices.  The website www.goodrx.com contains coupons for medications through different pharmacies. The prices here do not account for what the cost may be with help from insurance (it may be cheaper with your insurance), but the website can give you the price if you did not use any insurance.  - You can print the associated coupon and take it with your prescription to the pharmacy.  - You may also stop by our office during regular business hours and pick up  a GoodRx coupon card.  - If you need your prescription sent electronically to a different pharmacy, notify our office through [facility] or by phone at [phone] option 4.     Si Usted Necesita Algo Despus de Su Visita  Tambin puede enviarnos un mensaje a travs de Pharmacist, community. Por lo general respondemos a los mensajes de MyChart en el transcurso de 1 a 2 das hbiles.  Para renovar recetas, por favor pida a su farmacia que se ponga en contacto con nuestra oficina. Harland Dingwall de fax es [place] [phone].  Si tiene un asunto urgente cuando la clnica est cerrada y que no puede esperar hasta el siguiente da hbil, puede llamar/localizar a su doctor(a) al nmero que aparece a continuacin.   Por favor, tenga en cuenta que aunque hacemos todo lo posible para estar disponibles para asuntos urgentes fuera del horario de [place], no estamos disponibles las 24 horas del da, los 7 das de la [place].   Si tiene un problema urgente y no  puede comunicarse con nosotros, puede optar por buscar atencin mdica  en el consultorio de su doctor(a), en una clnica privada, en un centro de atencin urgente o en una sala de emergencias.  Si tiene Engineering geologist, por favor llame inmediatamente al 911 o vaya a la sala de emergencias.  Nmeros de bper  - Dr. Nehemiah Massed: [phone]  - Dra. Moye: [phone]  - Dra. Nicole Kindred: [phone]  En caso de inclemencias del [place], por favor llame a Johnsie Kindred principal al [phone] para una actualizacin sobre el [place] de cualquier retraso o cierre.  Consejos para la medicacin en dermatologa: Por favor, guarde las cajas en las que vienen los medicamentos de uso tpico para ayudarle a seguir las instrucciones sobre dnde y cmo usarlos. Las farmacias generalmente imprimen las instrucciones del medicamento slo en las cajas y no directamente en los tubos del [place].   Si su medicamento es muy caro, por favor, pngase en contacto  con Zigmund Daniel llamando al [phone] y presione la opcin 4 o envenos un mensaje a travs de Pharmacist, community.   No podemos decirle cul ser su copago por los medicamentos por adelantado ya que esto es diferente dependiendo de la cobertura de su seguro. Sin embargo, es posible que podamos encontrar un medicamento sustituto a Electrical engineer un formulario para que el seguro cubra el medicamento que se considera necesario.   Si se requiere una autorizacin previa para que su compaa de seguros [place] su medicamento, por favor permtanos de 1 a 2 das hbiles para completar este proceso.  Los precios de los medicamentos varan con frecuencia dependiendo del Environmental consultant de dnde se surte la receta y alguna farmacias pueden ofrecer precios ms baratos.  El sitio web www.goodrx.com tiene cupones para medicamentos de Airline pilot. Los precios aqu no tienen en cuenta lo que podra costar con la ayuda del seguro (puede ser ms barato con su seguro), pero el sitio web puede darle el precio si no utiliz Research scientist (physical sciences).  - Puede imprimir el cupn correspondiente y llevarlo con su receta a la farmacia.  - Tambin puede pasar por nuestra oficina durante el horario de atencin regular y Charity fundraiser una tarjeta de cupones de GoodRx.  - Si necesita que su receta se enve electrnicamente a una farmacia diferente, informe a nuestra oficina a travs de MyChart de [place] o por telfono llamando al [phone] y presione la opcin 4.

## 2022-02-22 ENCOUNTER — Other Ambulatory Visit: Payer: Self-pay | Admitting: Family Medicine

## 2022-02-22 DIAGNOSIS — Z1231 Encounter for screening mammogram for malignant neoplasm of breast: Secondary | ICD-10-CM

## 2022-04-07 ENCOUNTER — Ambulatory Visit
Admission: RE | Admit: 2022-04-07 | Discharge: 2022-04-07 | Disposition: A | Discharge status: Home / Self Care | Payer: BC Managed Care – PPO | Source: Ambulatory Visit | Attending: Family Medicine | Admitting: Family Medicine

## 2022-04-07 DIAGNOSIS — Z1231 Encounter for screening mammogram for malignant neoplasm of breast: Secondary | ICD-10-CM | POA: Insufficient documentation

## 2022-04-13 IMAGING — MG MM DIGITAL SCREENING BILAT W/ TOMO AND CAD
8 series · 8 of 24 positions shown · non-contrast
Comparison: Previous exam(s).

CLINICAL DATA: Screening.

EXAM:
DIGITAL SCREENING BILATERAL MAMMOGRAM WITH TOMOSYNTHESIS AND CAD
TECHNIQUE: Bilateral screening digital craniocaudal and mediolateral oblique
mammograms were obtained. Bilateral screening digital breast
tomosynthesis was performed. The images were evaluated with
computer-aided detection.

[R CC synth-2D]
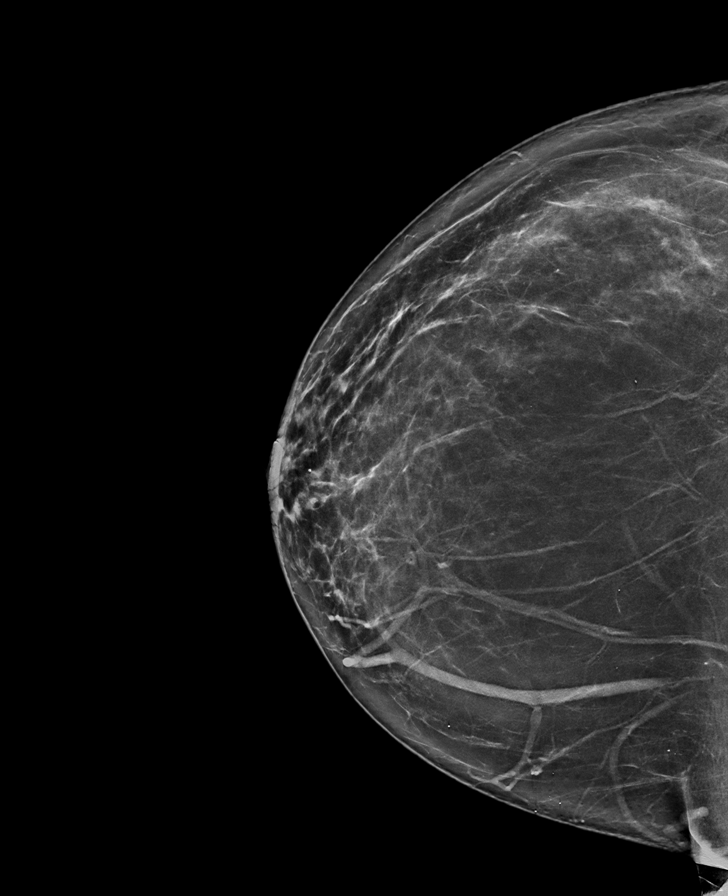

[R MLO synth-2D]
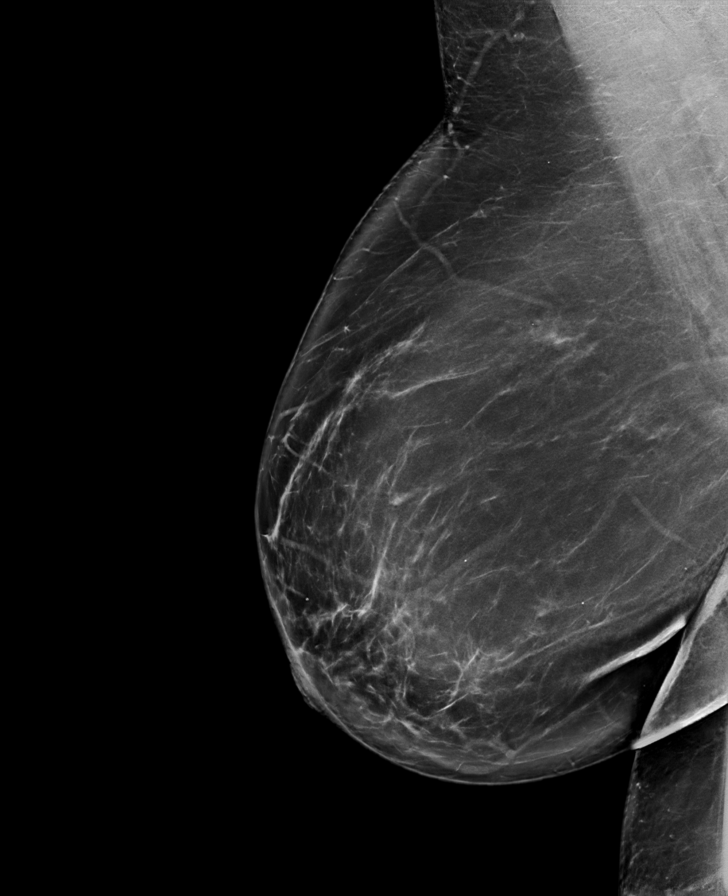

[L MLO synth-2D]
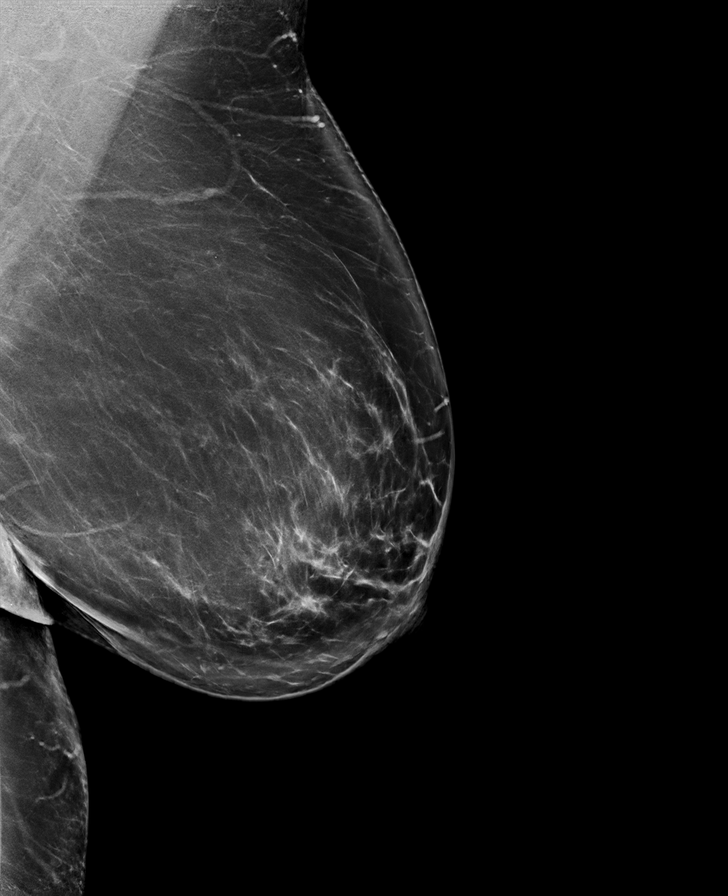

[L CC synth-2D]
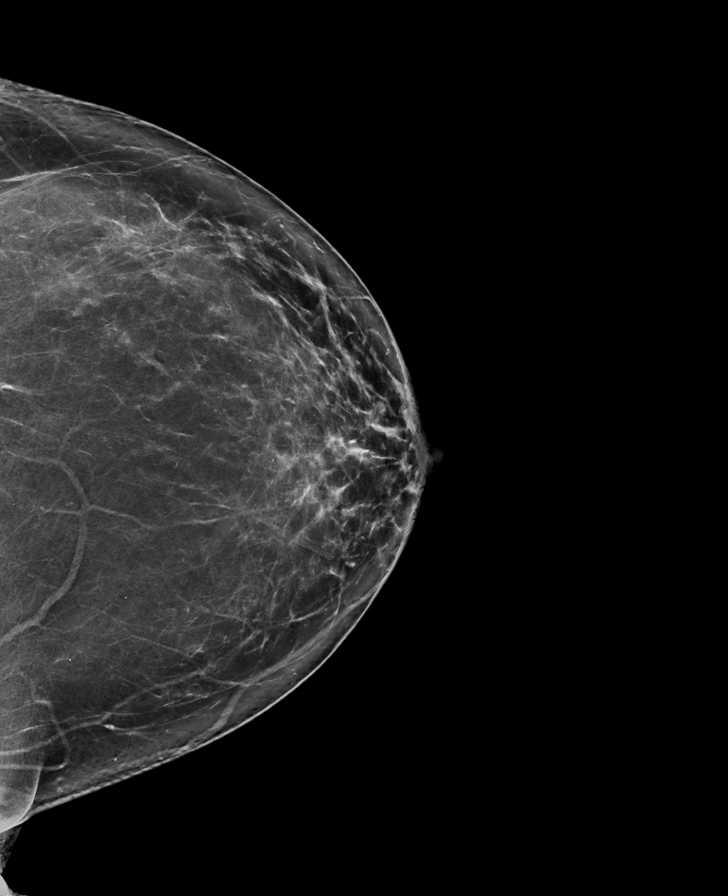

[L MLO tomo · tomo slice 53/105.0]
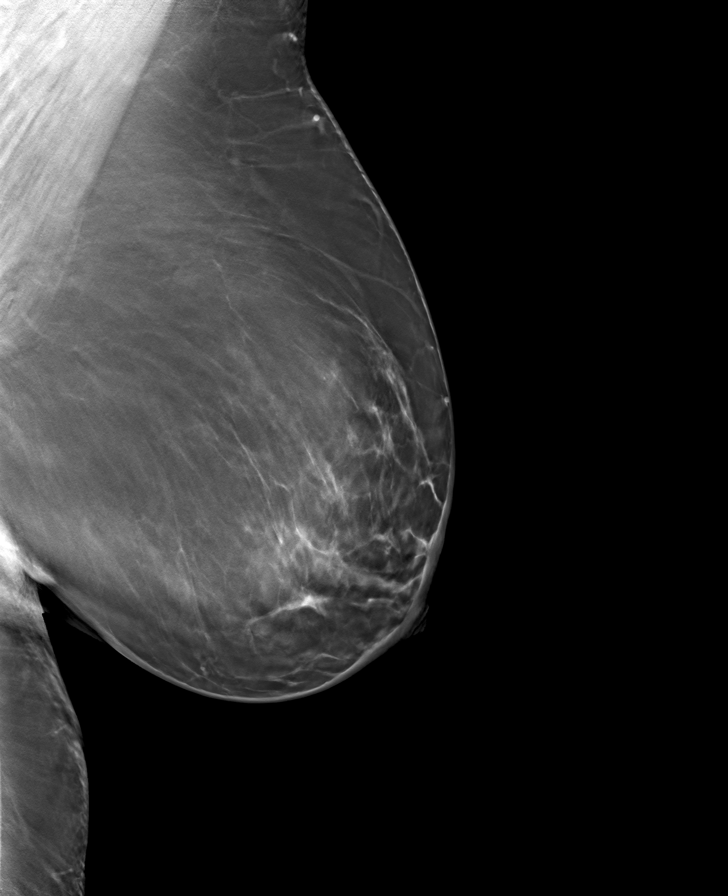

[R MLO tomo · tomo slice 53/106.0]
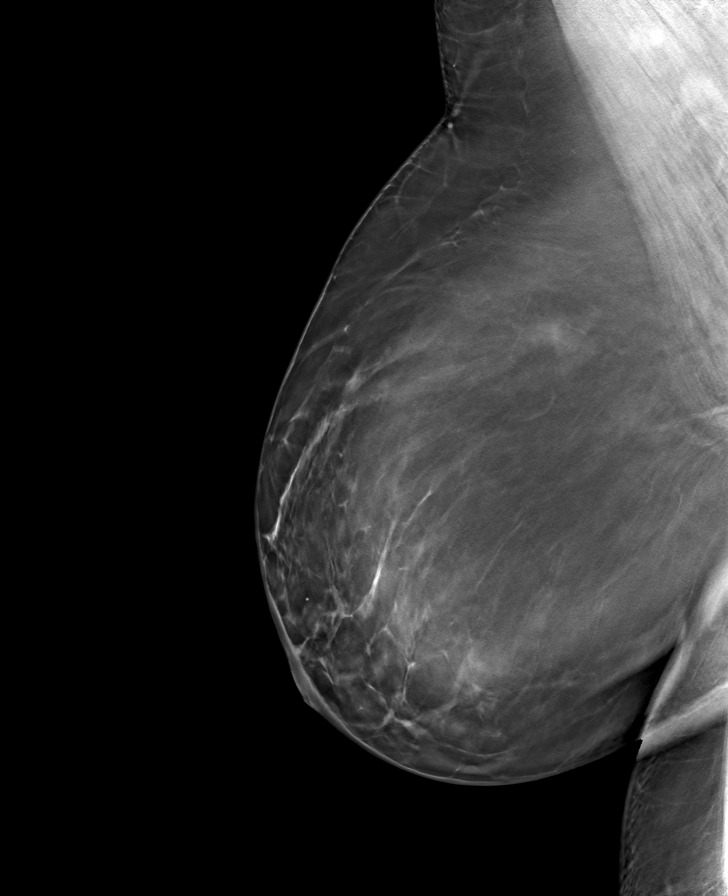

[L CC tomo · tomo slice 45/88.0]
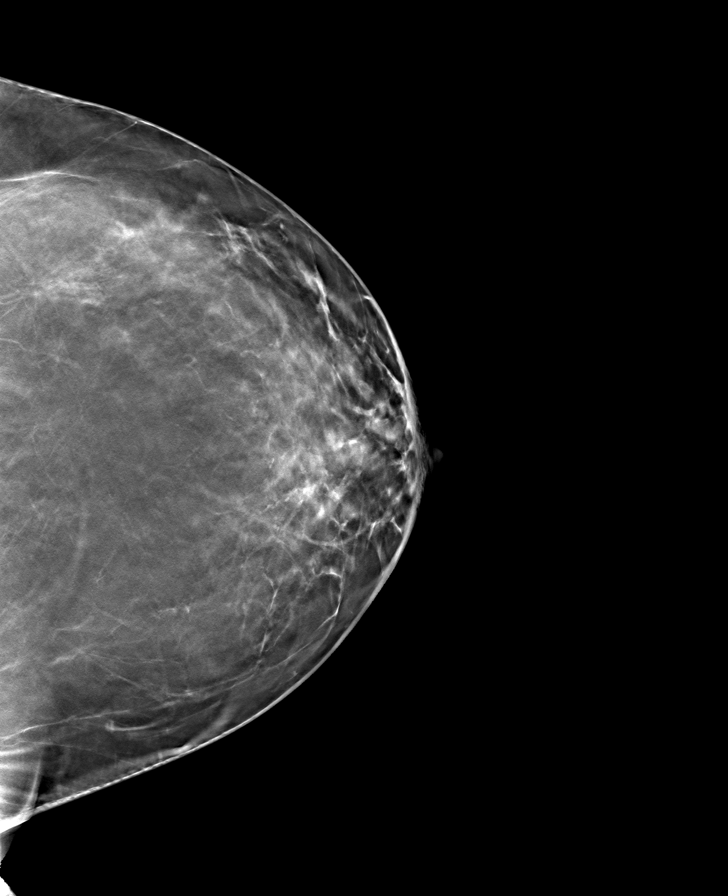

[R CC tomo · tomo slice 43/84.0]
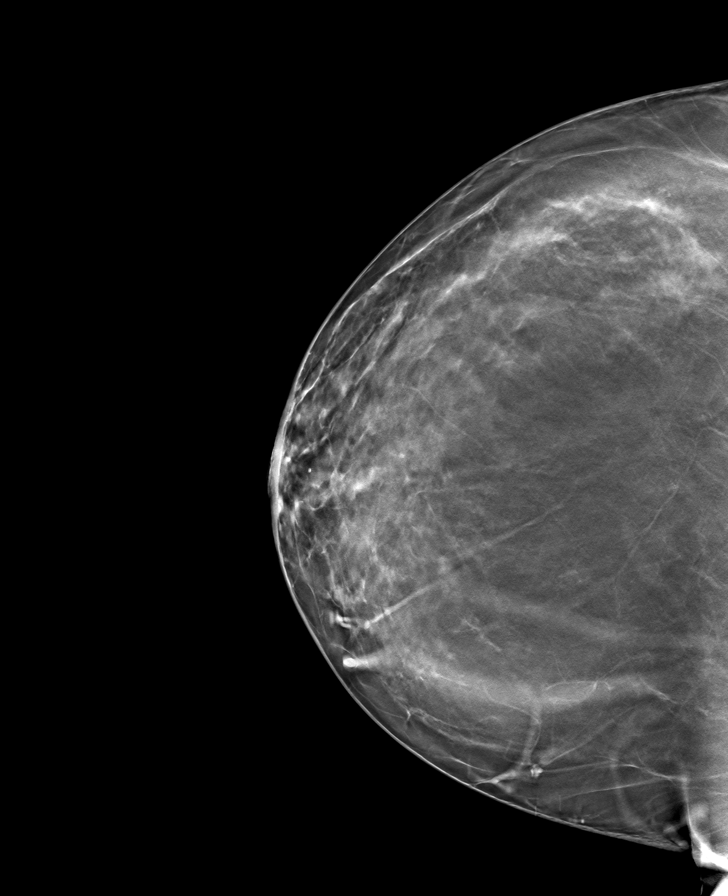

[8 of 24 positions shown; findings below may reference images not displayed]

ACR Breast Density Category b: There are scattered areas of
fibroglandular density.
FINDINGS: There are no findings suspicious for malignancy.
IMPRESSION: No mammographic evidence of malignancy. A result letter of this
screening mammogram will be mailed directly to the patient.

RECOMMENDATION:
Screening mammogram in one year. (Code:51-O-LD2)

BI-RADS CATEGORY  1: Negative.

## 2022-08-09 ENCOUNTER — Ambulatory Visit: Payer: BC Managed Care – PPO | Admitting: Podiatry

## 2023-02-20 ENCOUNTER — Ambulatory Visit: Payer: BC Managed Care – PPO | Admitting: Dermatology

## 2023-03-20 ENCOUNTER — Ambulatory Visit: Payer: BC Managed Care – PPO | Admitting: Dermatology

## 2023-04-03 ENCOUNTER — Other Ambulatory Visit: Payer: Self-pay | Admitting: Obstetrics and Gynecology

## 2023-04-03 DIAGNOSIS — Z1231 Encounter for screening mammogram for malignant neoplasm of breast: Secondary | ICD-10-CM

## 2023-05-11 ENCOUNTER — Ambulatory Visit
Admission: RE | Admit: 2023-05-11 | Discharge: 2023-05-11 | Disposition: A | Discharge status: Home / Self Care | Payer: Self-pay | Source: Ambulatory Visit | Attending: Obstetrics and Gynecology | Admitting: Obstetrics and Gynecology

## 2023-05-11 DIAGNOSIS — Z1231 Encounter for screening mammogram for malignant neoplasm of breast: Secondary | ICD-10-CM | POA: Insufficient documentation

## 2023-06-11 ENCOUNTER — Ambulatory Visit: Admitting: Dermatology

## 2023-06-11 ENCOUNTER — Encounter: Payer: Self-pay | Admitting: Dermatology

## 2023-06-11 DIAGNOSIS — D492 Neoplasm of unspecified behavior of bone, soft tissue, and skin: Secondary | ICD-10-CM | POA: Diagnosis not present

## 2023-06-11 DIAGNOSIS — L570 Actinic keratosis: Secondary | ICD-10-CM | POA: Diagnosis not present

## 2023-06-11 DIAGNOSIS — D0472 Carcinoma in situ of skin of left lower limb, including hip: Secondary | ICD-10-CM

## 2023-06-11 DIAGNOSIS — I878 Other specified disorders of veins: Secondary | ICD-10-CM | POA: Diagnosis not present

## 2023-06-11 DIAGNOSIS — D485 Neoplasm of uncertain behavior of skin: Secondary | ICD-10-CM

## 2023-06-11 DIAGNOSIS — L578 Other skin changes due to chronic exposure to nonionizing radiation: Secondary | ICD-10-CM | POA: Diagnosis not present

## 2023-06-11 DIAGNOSIS — W908XXA Exposure to other nonionizing radiation, initial encounter: Secondary | ICD-10-CM

## 2023-06-11 DIAGNOSIS — D099 Carcinoma in situ, unspecified: Secondary | ICD-10-CM

## 2023-06-11 HISTORY — DX: Carcinoma in situ, unspecified: D09.9

## 2023-06-11 NOTE — Patient Instructions (Addendum)

## 2023-06-11 NOTE — Progress Notes (Signed)
 Follow Up Visit   Subjective  Caitlin Blackburn is a 48 y.o. female who presents for the following: Rash  Itching at lower legs, neck and right eyebrow. Eyebrow not bothersome for patient and has been told in the past that it was psoriasis. Patient was given mometasone  when she saw Dr. Annette Barters 11/23 for stasis and was using it on her legs but it wasn't helping so she discontinued. Also using VaniCream to wash aa. Legs mostly bothersome and she thinks it could be worse there due to shaving.   Patient does take fluid pill and has edema, uses lymphedema pumps at home nightly.   The following portions of the chart were reviewed this encounter and updated as appropriate: medications, allergies, medical history  Review of Systems:  No other skin or systemic complaints except as noted in HPI or Assessment and Plan.  Objective  Well appearing patient in no apparent distress; mood and affect are within normal limits.  A focused examination was performed of the following areas: Legs, neck, face  Relevant exam findings are noted in the Assessment and Plan.  Left Lower Leg Superior 6 mm pink keratotic papule  Left Lower Leg Inferior 7 mm pink keratotic papule  Assessment & Plan   NEOPLASM OF UNCERTAIN BEHAVIOR OF SKIN (2) Left Lower Leg Superior Skin / nail biopsy Type of biopsy: tangential   Informed consent: discussed and consent obtained   Timeout: patient name, date of birth, surgical site, and procedure verified   Procedure prep:  Patient was prepped and draped in usual sterile fashion Prep type:  Isopropyl alcohol Anesthesia: the lesion was anesthetized in a standard fashion   Anesthetic:  1% lidocaine w/ epinephrine 1-100,000 buffered w/ 8.4% NaHCO3 Instrument used: DermaBlade   Hemostasis achieved with: pressure and aluminum chloride   Outcome: patient tolerated procedure well   Post-procedure details: sterile dressing applied and wound care instructions given   Dressing type:  bandage and petrolatum   Specimen 1 - Surgical pathology Differential Diagnosis: r/o SCC  Check Margins: No 6 mm pink keratotic papule Left Lower Leg Inferior Skin / nail biopsy Type of biopsy: tangential   Informed consent: discussed and consent obtained   Timeout: patient name, date of birth, surgical site, and procedure verified   Procedure prep:  Patient was prepped and draped in usual sterile fashion Prep type:  Isopropyl alcohol Anesthesia: the lesion was anesthetized in a standard fashion   Anesthetic:  1% lidocaine w/ epinephrine 1-100,000 buffered w/ 8.4% NaHCO3 Instrument used: DermaBlade   Hemostasis achieved with: pressure and aluminum chloride   Outcome: patient tolerated procedure well   Post-procedure details: sterile dressing applied and wound care instructions given   Dressing type: bandage and petrolatum   Specimen 2 - Surgical pathology Differential Diagnosis: r/o SCC  Check Margins: No 7 mm pink keratotic papule Related Medications mupirocin  ointment (BACTROBAN ) 2 % Apply 1 application topically daily. With dressing changes ACTINIC ELASTOSIS   VENOUS STASIS OF BOTH LOWER EXTREMITIES    VENOUS STASIS Exam: Erythematous scaly papules with associated lower leg edema.  flared  Stasis in the legs causes chronic leg swelling, which may result in itchy or painful rashes, skin discoloration, skin texture changes, and sometimes ulceration.  Recommend daily graduated compression hose/stockings- easiest to put on first thing in morning, remove at bedtime.  Elevate legs as much as possible. Avoid salt/sodium rich foods.  Treatment Plan: Compression socks daily as tolerated  ACTINIC DAMAGE - chronic, secondary to cumulative UV radiation  exposure/sun exposure over time - diffuse scaly erythematous macules with underlying dyspigmentation - Recommend daily broad spectrum sunscreen SPF 30+ to sun-exposed areas, reapply every 2 hours as needed.  - Recommend staying  in the shade or wearing long sleeves, sun glasses (UVA+UVB protection) and wide brim hats (4-inch brim around the entire circumference of the hat). - Call for new or changing lesions. - do not recommend tanning bed use given skin cancer risk. Try self tanner   Return if symptoms worsen or fail to improve.  Kerstin Peeling, RMA, am acting as scribe for Harris Liming, MD .   Documentation: I have reviewed the above documentation for accuracy and completeness, and I agree with the above.  Harris Liming, MD

## 2023-06-14 LAB — SURGICAL PATHOLOGY

## 2023-06-19 ENCOUNTER — Ambulatory Visit: Payer: Self-pay

## 2023-06-19 MED ORDER — FLUOROURACIL 5 % EX CREA: TOPICAL_CREAM | CUTANEOUS | 2 refills | Status: AC

## 2023-06-19 NOTE — Telephone Encounter (Signed)
 Discussed pathology results and treatment plan. Patient voiced understanding. She will treat as directed with 5FU/Calcipotriene cream to sites, allow to heal then treat both lower legs in sections.

## 2023-06-19 NOTE — Telephone Encounter (Signed)
-----   Message from [facility] sent at 06/16/2023 10:33 AM EDT ----- 1. Skin, left lower leg superior :       HYPERTROPHIC ACTINIC KERATOSIS        2. Skin, left lower leg inferior :       SQUAMOUS CELL CARCINOMA IN SITU, ASSOCIATED WITH VERRUCA   Please call to share that the biopsies from the left leg show a precancer and a squamous skin cancer in the top layer of skin. Based on these results, many of the lesions on both lower legs are likely emerging precancers and squamous skin cancers from tanning bed use and sun exposure. Squamous skin cancers can spread and be deadly if left untreated. Recommend allowing the biopsy sites 1 month to heal and then treating with 5FU/calcipotriene twice daily until reaction develops (redness irritation itching crusting -- typically by day 7). Apply to both lower legs in stages (3 x 3 inches at a time). If reaction is too intense, contact us  for an ointment. If new spots form, contact us  for an appointment. Recommend stopping tanning bed use and protecting skin with SPF 30 or higher physical sunscreen (zinc or titanium) when outdoors. Schedule recheck in 4 months after some areas have been treated.

## 2023-11-26 ENCOUNTER — Ambulatory Visit: Admitting: Dermatology
